# Patient Record
Sex: Male | Born: 1940 | Race: Asian | Hispanic: No | Marital: Married | Smoking: Former smoker
Health system: Southern US, Community
[De-identification: ages and names within clinical notes are randomized; demographics above are authoritative.]

## PROBLEM LIST (undated history)

## (undated) DIAGNOSIS — E039 Hypothyroidism, unspecified: Secondary | ICD-10-CM

## (undated) DIAGNOSIS — I10 Essential (primary) hypertension: Secondary | ICD-10-CM

## (undated) DIAGNOSIS — I2510 Atherosclerotic heart disease of native coronary artery without angina pectoris: Secondary | ICD-10-CM

## (undated) DIAGNOSIS — R918 Other nonspecific abnormal finding of lung field: Secondary | ICD-10-CM

## (undated) DIAGNOSIS — E785 Hyperlipidemia, unspecified: Secondary | ICD-10-CM

## (undated) DIAGNOSIS — E119 Type 2 diabetes mellitus without complications: Secondary | ICD-10-CM

## (undated) DIAGNOSIS — C61 Malignant neoplasm of prostate: Secondary | ICD-10-CM

## (undated) HISTORY — DX: Other nonspecific abnormal finding of lung field: R91.8

## (undated) HISTORY — DX: Atherosclerotic heart disease of native coronary artery without angina pectoris: I25.10

## (undated) HISTORY — DX: Hypothyroidism, unspecified: E03.9

## (undated) HISTORY — DX: Type 2 diabetes mellitus without complications: E11.9

## (undated) HISTORY — DX: Essential (primary) hypertension: I10

## (undated) HISTORY — DX: Hyperlipidemia, unspecified: E78.5

---

## 2008-07-25 ENCOUNTER — Ambulatory Visit (HOSPITAL_COMMUNITY): Admission: RE | Admit: 2008-07-25 | Discharge: 2008-07-25 | Payer: Self-pay | Admitting: Internal Medicine

## 2010-11-14 ENCOUNTER — Encounter: Payer: Self-pay | Admitting: Internal Medicine

## 2011-03-09 ENCOUNTER — Emergency Department (HOSPITAL_COMMUNITY)
Admission: EM | Admit: 2011-03-09 | Discharge: 2011-03-09 | Disposition: A | Discharge status: Other Acute Inpatient Hospital | Payer: Medicare Other | Source: Home / Self Care | Attending: Emergency Medicine | Admitting: Emergency Medicine

## 2011-03-09 ENCOUNTER — Emergency Department (HOSPITAL_COMMUNITY): Payer: Medicare Other

## 2011-03-09 ENCOUNTER — Inpatient Hospital Stay (HOSPITAL_COMMUNITY)
Admission: AD | Admit: 2011-03-09 | Discharge: 2011-03-12 | DRG: 247 | Disposition: A | Discharge status: Home / Self Care | Payer: Medicare Other | Source: Other Acute Inpatient Hospital | Attending: Cardiology | Admitting: Cardiology

## 2011-03-09 DIAGNOSIS — E039 Hypothyroidism, unspecified: Secondary | ICD-10-CM | POA: Insufficient documentation

## 2011-03-09 DIAGNOSIS — E119 Type 2 diabetes mellitus without complications: Secondary | ICD-10-CM | POA: Diagnosis present

## 2011-03-09 DIAGNOSIS — M549 Dorsalgia, unspecified: Secondary | ICD-10-CM | POA: Insufficient documentation

## 2011-03-09 DIAGNOSIS — I10 Essential (primary) hypertension: Secondary | ICD-10-CM | POA: Diagnosis present

## 2011-03-09 DIAGNOSIS — I214 Non-ST elevation (NSTEMI) myocardial infarction: Principal | ICD-10-CM | POA: Diagnosis present

## 2011-03-09 DIAGNOSIS — R079 Chest pain, unspecified: Secondary | ICD-10-CM

## 2011-03-09 DIAGNOSIS — R222 Localized swelling, mass and lump, trunk: Secondary | ICD-10-CM | POA: Insufficient documentation

## 2011-03-09 DIAGNOSIS — E785 Hyperlipidemia, unspecified: Secondary | ICD-10-CM | POA: Diagnosis present

## 2011-03-09 DIAGNOSIS — I2589 Other forms of chronic ischemic heart disease: Secondary | ICD-10-CM | POA: Diagnosis present

## 2011-03-09 DIAGNOSIS — Z7982 Long term (current) use of aspirin: Secondary | ICD-10-CM

## 2011-03-09 DIAGNOSIS — I2510 Atherosclerotic heart disease of native coronary artery without angina pectoris: Secondary | ICD-10-CM | POA: Diagnosis present

## 2011-03-09 DIAGNOSIS — R51 Headache: Secondary | ICD-10-CM | POA: Insufficient documentation

## 2011-03-09 DIAGNOSIS — E7800 Pure hypercholesterolemia, unspecified: Secondary | ICD-10-CM | POA: Insufficient documentation

## 2011-03-09 LAB — DIFFERENTIAL
Basophils Absolute: 0 10*3/uL (ref 0.0–0.1)
Basophils Relative: 1 % (ref 0–1)
Lymphocytes Relative: 27 % (ref 12–46)
Lymphs Abs: 1.7 10*3/uL (ref 0.7–4.0)
Monocytes Absolute: 0.9 10*3/uL (ref 0.1–1.0)
Monocytes Relative: 13 % — ABNORMAL HIGH (ref 3–12)
Neutro Abs: 3.6 10*3/uL (ref 1.7–7.7)
Neutrophils Relative %: 57 % (ref 43–77)

## 2011-03-09 LAB — BASIC METABOLIC PANEL
CO2: 27 mEq/L (ref 19–32)
Calcium: 9 mg/dL (ref 8.4–10.5)
Chloride: 101 mEq/L (ref 96–112)
GFR calc Af Amer: >60 mL/min (ref >60)
Glucose, Bld: 189 mg/dL — ABNORMAL HIGH (ref 70–99)
Potassium: 4.2 mEq/L (ref 3.5–5.1)
Sodium: 136 mEq/L (ref 135–145)

## 2011-03-09 LAB — TROPONIN I: Troponin I: 2.91 ng/mL (ref <0.30)

## 2011-03-09 LAB — CK TOTAL AND CKMB (NOT AT ARMC)
CK, MB: 3.1 ng/mL (ref 0.3–4.0)
Relative Index: 1.7 (ref 0.0–2.5)

## 2011-03-09 LAB — CBC
HCT: 38.9 % — ABNORMAL LOW (ref 39.0–52.0)
MCH: 30.8 pg (ref 26.0–34.0)
RBC: 4.29 MIL/uL (ref 4.22–5.81)
WBC: 6.3 10*3/uL (ref 4.0–10.5)

## 2011-03-09 MED ORDER — IOHEXOL 300 MG/ML  SOLN
100.0000 mL | Freq: Once | INTRAMUSCULAR | Status: AC | PRN
  Administered 2011-03-09: 100 mL via INTRAVENOUS

## 2011-03-10 ENCOUNTER — Institutional Professional Consult (permissible substitution): Payer: Medicare Other | Admitting: Cardiology

## 2011-03-10 DIAGNOSIS — I2510 Atherosclerotic heart disease of native coronary artery without angina pectoris: Secondary | ICD-10-CM

## 2011-03-10 LAB — TSH: TSH: 7.828 u[IU]/mL — ABNORMAL HIGH (ref 0.350–4.500)

## 2011-03-10 LAB — LIPID PANEL
HDL: 44 mg/dL (ref >39)
Triglycerides: 160 mg/dL — ABNORMAL HIGH (ref <150)
VLDL: 32 mg/dL (ref 0–40)

## 2011-03-10 LAB — CBC
HCT: 38 % — ABNORMAL LOW (ref 39.0–52.0)
MCH: 30.8 pg (ref 26.0–34.0)
MCV: 90 fL (ref 78.0–100.0)
RDW: 12.8 % (ref 11.5–15.5)
WBC: 6 10*3/uL (ref 4.0–10.5)

## 2011-03-10 LAB — POCT ACTIVATED CLOTTING TIME: Activated Clotting Time: 505 seconds

## 2011-03-10 LAB — PROTIME-INR: INR: 1.05 (ref 0.00–1.49)

## 2011-03-10 LAB — CARDIAC PANEL(CRET KIN+CKTOT+MB+TROPI)
Troponin I: 2.96 ng/mL (ref <0.30)
Troponin I: 2.48 ng/mL (ref <0.30)

## 2011-03-10 LAB — HEMOGLOBIN A1C: Hgb A1c MFr Bld: 7.2 % — ABNORMAL HIGH (ref <5.7)

## 2011-03-11 DIAGNOSIS — I214 Non-ST elevation (NSTEMI) myocardial infarction: Secondary | ICD-10-CM

## 2011-03-11 LAB — BASIC METABOLIC PANEL
GFR calc Af Amer: >60 mL/min (ref >60)
GFR calc non Af Amer: >60 mL/min (ref >60)
Potassium: 4 mEq/L (ref 3.5–5.1)
Sodium: 137 mEq/L (ref 135–145)

## 2011-03-11 LAB — CBC
Platelets: 279 10*3/uL (ref 150–400)
RDW: 12.6 % (ref 11.5–15.5)
WBC: 7 10*3/uL (ref 4.0–10.5)

## 2011-03-12 DIAGNOSIS — I059 Rheumatic mitral valve disease, unspecified: Secondary | ICD-10-CM

## 2011-03-12 LAB — BASIC METABOLIC PANEL
CO2: 27 mEq/L (ref 19–32)
GFR calc non Af Amer: >60 mL/min (ref >60)
Glucose, Bld: 158 mg/dL — ABNORMAL HIGH (ref 70–99)
Potassium: 4.3 mEq/L (ref 3.5–5.1)
Sodium: 138 mEq/L (ref 135–145)

## 2011-03-12 LAB — CBC
HCT: 36.3 % — ABNORMAL LOW (ref 39.0–52.0)
Hemoglobin: 12.7 g/dL — ABNORMAL LOW (ref 13.0–17.0)
RBC: 4.07 MIL/uL — ABNORMAL LOW (ref 4.22–5.81)
RDW: 12.5 % (ref 11.5–15.5)
WBC: 6.8 10*3/uL (ref 4.0–10.5)

## 2011-03-14 ENCOUNTER — Encounter: Payer: Self-pay | Admitting: Cardiology

## 2011-03-15 ENCOUNTER — Encounter: Payer: Self-pay | Admitting: Cardiology

## 2011-03-17 NOTE — Cardiovascular Report (Signed)
[name], [name]           ACCOUNT NO.:  [id number]  MEDICAL RECORD NO.:  [id number]           PATIENT TYPE:  I  LOCATION:  2917                         FACILITY:  [facility]  PHYSICIAN:  Yana Schorr M. [place], M.D.  DATE OF BIRTH:  1941/02/15  DATE OF PROCEDURE: DATE OF DISCHARGE:                           CARDIAC CATHETERIZATION   INDICATIONS FOR PROCEDURE:  A 70 year old male with history of hyperlipidemia and hypertension presents with a non-ST-elevation myocardial infarction.  PROCEDURE:  Left heart catheterization, coronary and left ventricular angiography and intracoronary stenting of the right coronary artery.  ACCESS:  Via the right radial artery using a standard Seldinger technique.  EQUIPMENT:  5-French 4 cm right Judkins catheter, 5-French XBU left coronary artery catheter, 5-French pigtail catheter, 5-French arterial sheath, 6-French left Amplatz one guide, 6-French arterial sheath, Prowater wire, a 2.0 x 20-mm Apex balloon, a 2.5 x 20-mm Apex balloon, a 2.5 x 33 mm Xience Prime LL stent, also a 2.5 x 38 mm Xience Prime LL stent, and a 2.75 x 21-mm Craig Sprinter RX balloon.  MEDICATIONS:  Local anesthesia with 1% Xylocaine, Versed a total of 3 mg IV, fentanyl total of 50 mcg IV, verapamil 3 mg intraarterial, heparin bolus at 3000 units IV, Angiomax 0.75 mg/kg IV bolus followed by continuous infusion of 1.75 mg/kg per hour.  Subsequent ACT was 505 seconds.  Effient 600 mg p.o., nitroglycerin 200 mcg intracoronary x3.  CONTRAST:  190 mL of Omnipaque.  HEMODYNAMIC DATA:  The aortic pressure is 137/64 with a mean of 94 mmHg. Left ventricle pressure is 137 with an EDP of 14 mmHg.  ANGIOGRAPHIC DATA:  The right coronary arises anteriorly.  There is a long 99% stenosis in the midvessel with TIMI grade 1 flow.  There are faint left to right collaterals to the posterolateral branches.  The left main coronary artery is heavily calcified.  There is a 30-40% narrowing in the  distal left main.  The left anterior descending artery is also calcified proximally.  It has diffuse 30% narrowing in the midvessel.  The first diagonal branch is occluded proximally.  The left circumflex coronary artery has 20-30% disease at the ostium and in the midvessel.  Left ventricular angiography performed in the RAO view demonstrates moderate-to-severe inferior wall hypokinesis with akinesis of the distal anterior wall.  Overall ejection fraction is 40-45%.  We proceeded at this point with a percutaneous intervention of the right coronary artery which appeared to be the culprit lesion.  After anticoagulation, we engaged the vessel with a left Amplatz 1 guide.  We were able to cross with a wire with minimal difficulty.  We then predilated the lesion using a 2.0 x 20-mm Apex balloon up to 12 atmospheres.  We then dilated the same segment using a 2.5 mm balloon up to 12 atmospheres.  We then used 2 long stents to cover the entire diseased segment.  We placed a 2.5 x 33 mm Xience Prime stent further distally covering the crux of the right coronary artery.  This was deployed at 12 atmospheres.  We then deployed a second 2.5 x 38 mm Xience Prime stent more proximally with overlap  of the initial stent. This was also deployed at 12 atmospheres.  We postdilated the entire stented segment with a 2.5 x 21-mm Palmer Sprinter RX balloon up to 16 atmospheres x4.  This yielded an excellent angiographic result with 0% residual stenosis and TIMI grade 3 flow.  FINAL INTERPRETATION: 1. Two-vessel obstructive coronary artery disease involving the right     coronary artery and diagonal. 2. Moderate left ventricular dysfunction. 3. Successful stenting of the proximal to mid right coronary artery     with 2 overlapping drug-eluting stents.  PLAN:  We will continue on aspirin and Plavix indefinitely.          ______________________________ Donda Friedli M. [place], M.D.     PMJ/MEDQ  D:   03/10/2011  T:  03/11/2011  Job:  [id number]  cc:   Margaretmary Bayley, M.D. Rollene Rotunda, MD, [facility]  Electronically Signed by Omega Durante [place] M.D. on 03/17/2011 11:51:57 AM

## 2011-03-18 NOTE — Discharge Summary (Signed)
NAMESHELLIE, GOETTL           ACCOUNT NO.:  [id number]  MEDICAL RECORD NO.:  [id number]           PATIENT TYPE:  I  LOCATION:  2001                         FACILITY:  [facility]  PHYSICIAN:  Keith Range, MD       DATE OF BIRTH:  1941/01/26  DATE OF ADMISSION:  03/09/2011 DATE OF DISCHARGE:  03/12/2011                              DISCHARGE SUMMARY   PRIMARY CARDIOLOGIST:  Rollene Rotunda, MD, [facility]  PRIMARY CARE PHYSICIAN:  Margaretmary Bayley, MD  REASON FOR ADMISSION:  Non-ST-elevation myocardial infarction.  DISCHARGE DIAGNOSES: 1. Coronary artery disease status post non-ST elevation myocardial     infarction this admission.     a.     Treated with drug-eluting stent x2 to the proximal mid right      coronary artery this admission. 2. Ischemic cardiomyopathy with ejection fraction of 40-45% and     moderate to severe inferior hypokinesis and distal anterior     akinesis.     a.     Followup echocardiogram Mar 12, 2011 demonstrates improved      EF of 50-55% with grade 1 diastolic dysfunction. 3. Hypertension. 4. Hyperlipidemia. 5. Hypothyroidism. 6. Left upper lobe mass on chest CT.     a.     Repeat chest CT recommended in 4-6 months.  PROCEDURE PERFORMED THIS ADMISSION:  Cardiac catheterization, percutaneous intervention by Dr. Peter [place] Mar 10, 2011:  Status post drug-eluting stent placement x2 to the RCA, chronically occluded first diagonal, EF 40-45%.  ADMISSION HISTORY:  Mr. Keith [name] is a 70 year old male who presented to the emergency room with about 4 days of stuttering chest pain.  His cardiac markers were abnormal.  His D-dimer was negative.  He was admitted for non-ST elevation myocardial infarction for further evaluation and treatment.  HOSPITAL COURSE:  He was treated with aspirin, heparin, and beta- blocker.  His losartan HCT was switched to losartan 25 mg and Toprol-XL 25 mg a day was also added.  He did have a chest and abdominal CT with contrast.  This  was negative for pulmonary emboli.  He did have coronary calcifications and a 9-mm pleural based spiculated mass of the left upper lobe.  A 4-6 month followup is recommended.  His abdominal CT was negative.  He underwent cardiac catheterization by Dr. Peter [place] on Mar 10, 2011.  This demonstrated 99% stenosis in mid vessel of the RCA, 30-40% distal left main stenosis, 30% mid LAD stenosis, first diagonal was occluded and left circumflex had 20-30% ostial mid vessel stenosis. Left ventriculogram demonstrated moderate-to-severe inferior hypokinesis and distal anterior akinesis with an EF of 40-45%.  The patient subsequently underwent PCI with successful drug eluting stent placement x2 to the mid RCA (2.5 x 33 mm and 2.5 x 38 mm Xience Prime drug-eluting stents).  Aspirin and Effient will be recommended for a minimum of 12 months.  He was noted to have mild hyperglycemia and hemoglobin A1c was abnormal at 7.2.  He would likely be a good candidate for metformin but given his recent cardiac catheterization this was not started at the time of discharge.  He should follow up closely with  his primary care physician for further management of his new onset diabetes mellitus.  He was evaluated by Dr. Johney Frame on Mar 12, 2011 and felt to be stable enough for discharge to home.  LABORATORY AND ANCILLARY DATA:  Hemoglobin 12.7, MCV 89.2, platelet count 273,000, D-dimer less than 0.22.  Sodium 138, potassium 4.3, glucose 158, BUN 10, creatinine 0.96, hemoglobin A1c 7.2.  Peak troponin 2.96.  CK-MBs negative x3.  Total cholesterol 172, triglycerides 160, HDL 44, LDL 96.  TSH 7.828, free T4 1.09.  Chest CT as noted above.  CT of the head with minimal chronic microvascular ischemia.  No acute abnormalities.  Chest x-ray negative.  Echocardiogram performed Mar 12, 2011 demonstrates mild focal basal hypertrophy of the septum, EF 50-55%, distal anteroseptal and apical hypokinesis, grade 1  diastolic dysfunction, mild mitral regurgitation, trivial tricuspid regurgitation, no pericardial effusion.  ALLERGIES:  No known drug allergies.  DISCHARGE MEDICATIONS: 1. Losartan 25 mg daily. 2. Metoprolol succinate XL 25 mg daily. 3. Effient 10 mg daily. 4. Pravastatin was increased from 40-80 mg at bedtime this admission. 5. Aspirin 81 mg daily. 6. Centrum daily. 7. Levothyroxine 125 mcg daily.  He has been advised to stop taking losartan HCTZ 100/12.5 mg.  ACTIVITY:  He is to increase activity slowly, he may shower, he may walk steps.  No lifting or sexual activity for 2 weeks, no driving for 1 week.  DIET:  Low-fat, low-sodium diet.  WOUND CARE:  He should call our office for any swelling, bleeding, bruising, or fever.  FOLLOWUP: 1. He will need to see Dr. Antoine Poche or the physician assistant in 2 weeks     and the office will contact him with appointment. 2. He should follow up with Dr. Chestine Spore in the next 2 weeks for further     management of his newly diagnosed diabetes mellitus. 3. He will need a Chest CT with contrast in 4-6 months to follow up      on the Left Upper Lobe mass.  Total physician and PA time greater than 30 minutes.     Keith Newcomer, PA-C   ______________________________ Keith Range, MD    SW/MEDQ  D:  03/12/2011  T:  03/13/2011  Job:  [id number]  cc:   Margaretmary Bayley, M.D.  Electronically Signed by Keith Newcomer PA-C on 03/13/2011 07:30:28 AM Electronically Signed by Keith Range MD on 03/18/2011 05:54:53 PM

## 2011-03-30 ENCOUNTER — Encounter: Payer: Self-pay | Admitting: Physician Assistant

## 2011-03-30 ENCOUNTER — Ambulatory Visit (INDEPENDENT_AMBULATORY_CARE_PROVIDER_SITE_OTHER): Payer: Medicare Other | Admitting: Physician Assistant

## 2011-03-30 VITALS — BP 134/68 | Ht 65.0 in | Wt 120.0 lb

## 2011-03-30 DIAGNOSIS — E785 Hyperlipidemia, unspecified: Secondary | ICD-10-CM | POA: Insufficient documentation

## 2011-03-30 DIAGNOSIS — I2510 Atherosclerotic heart disease of native coronary artery without angina pectoris: Secondary | ICD-10-CM

## 2011-03-30 DIAGNOSIS — R222 Localized swelling, mass and lump, trunk: Secondary | ICD-10-CM

## 2011-03-30 DIAGNOSIS — E119 Type 2 diabetes mellitus without complications: Secondary | ICD-10-CM

## 2011-03-30 DIAGNOSIS — E039 Hypothyroidism, unspecified: Secondary | ICD-10-CM | POA: Insufficient documentation

## 2011-03-30 DIAGNOSIS — I10 Essential (primary) hypertension: Secondary | ICD-10-CM

## 2011-03-30 DIAGNOSIS — R918 Other nonspecific abnormal finding of lung field: Secondary | ICD-10-CM

## 2011-03-30 DIAGNOSIS — I255 Ischemic cardiomyopathy: Secondary | ICD-10-CM | POA: Insufficient documentation

## 2011-03-30 DIAGNOSIS — I2589 Other forms of chronic ischemic heart disease: Secondary | ICD-10-CM

## 2011-03-30 MED ORDER — LOSARTAN POTASSIUM 50 MG PO TABS: 50.0000 mg | ORAL_TABLET | Freq: Every day | ORAL | Status: DC

## 2011-03-30 MED ORDER — PRASUGREL HCL 10 MG PO TABS: 10.0000 mg | ORAL_TABLET | Freq: Every day | ORAL | Status: DC

## 2011-03-30 NOTE — Assessment & Plan Note (Signed)
He has a remote history of smoking.  We will arrange a followup CT in 3 months.

## 2011-03-30 NOTE — Patient Instructions (Addendum)
Increase losartan to 50 mg daily.  You can take 2 tablets of the 25 mg to equal 50 mg.  A new prescription has been sent to your pharmacy.  Your physician recommends that you return for lab work in: 1-2  WEEKS BMET 401.9  You have been referred to CARDIAC REHAB IN [place] AS PER [name], PA-C  CHEST CT FOR LUNG NODULE 786.6 THIS IS TO BE DONE IN 3 MONTHS AS PER [name], PA-C  A [facility] FORM WAS FILLED OUT TODAY FOR ASSISTANCE IN GETTING YOUR EFFIENT, AND WAS ALSO FAXED TODAY

## 2011-03-30 NOTE — Assessment & Plan Note (Addendum)
Doing well post MI.  He is having trouble affording Effient.  We will enroll him in Effient assistance.  I discussed the importance of dual antiplatelet therapy.  Continue statin therapy.  We will refer him to cardiac rehabilitation in [facility].  Follow up with Dr. Antoine Poche in 3 months.

## 2011-03-30 NOTE — Progress Notes (Signed)
History of Present Illness: Primary Cardiologist:  Dr. Rollene Rotunda  Keith [name] is a 70 y.o. male who presented to [facility] 5/16 with chest pain.  He ruled in for a non-ST elevation myocardial infarction.  Cardiac catheterization demonstrated critical stenosis in the mid RCA that was treated with 2 drug-eluting stents.  He had an occluded first diagonal.  His EF was 40-45% with inferior hypokinesis and distal anterior akinesis.  Follow up echocardiogram demonstrated improved LV function with an EF 50-55%.  He did have a chest CT that demonstrated a left upper lobe mass.  Follow up CT was recommended in 4-6 months.  He was placed on aspirin and Effient.  His pravastatin was increased.  He returns today for follow up.  He is doing well.  No chest pain, dyspnea, orthopnea, PND, edema or syncope.  He is taking all medications.  Having trouble affording Effient.  He is interested in cardiac rehab.  He has followed up with his PCP for his diabetes.    Past Medical History  Diagnosis Date  . CAD (coronary artery disease)     a. NSTEMI 5/12: Xience DES x 2 to RCA; b. cath 5/12: mRCA 99% (tx with PCI); dLM 30-40%, pLAD 30%, D1 occluded, CFX 20-30%, EF 40-45% with mod to severe inf HK and dist Ant AK  . Ischemic cardiomyopathy     a. echo 5/12 (after PCI): EF 50-55%, AS and apical HK, grade 1 diast dysfxn, mild MR  . DM2 (diabetes mellitus, type 2)   . HTN (hypertension)   . HLD (hyperlipidemia)   . Hypothyroidism   . Lung mass     LUL mass on chest CT 5/12; need repeat in 06/2011    Current Outpatient Prescriptions  Medication Sig Dispense Refill  . aspirin 81 MG tablet Take 81 mg by mouth daily.        Marland Kitchen levothyroxine (SYNTHROID, LEVOTHROID) 125 MCG tablet Take 125 mcg by mouth daily.        Marland Kitchen losartan (COZAAR) 50 MG tablet Take 1 tablet (50 mg total) by mouth daily.  30 tablet  11  . metoprolol succinate (TOPROL-XL) 25 MG 24 hr tablet Take 25 mg by mouth daily.        .  Multiple Vitamin (MULTIVITAMIN) tablet Take 1 tablet by mouth daily.        . prasugrel (EFFIENT) 10 MG TABS Take 10 mg by mouth daily.        . pravastatin (PRAVACHOL) 80 MG tablet Take 80 mg by mouth daily.        Marland Kitchen DISCONTD: losartan (COZAAR) 25 MG tablet Take 25 mg by mouth daily.          Allergies: No Known Allergies  Vital Signs: BP 134/68  Ht 5\' 5"  (1.651 m)  Wt 120 lb (54.432 kg)  BMI 19.97 kg/m2  PHYSICAL EXAM: Well nourished, well developed, in no acute distress HEENT: normal Neck: no JVD Cardiac:  normal S1, S2; RRR; no murmur Lungs:  clear to auscultation bilaterally, no wheezing, rhonchi or rales Abd: soft, nontender, no hepatomegaly Ext: no edema; Right radial site without hematoma or bruit Skin: warm and dry Neuro:  CNs 2-12 intact, no focal abnormalities noted  EKG:  Sinus bradycardia, heart rate 57, normal axis, T wave inversion in one, aVL  ASSESSMENT AND PLAN:

## 2011-03-30 NOTE — Assessment & Plan Note (Signed)
Goal A1c would be 7.5-8.  Followup with PCP.

## 2011-03-30 NOTE — Assessment & Plan Note (Signed)
Goal LDL less than 70.  This is managed by his primary care physician.

## 2011-03-30 NOTE — Assessment & Plan Note (Signed)
Ejection fraction has recovered.  He has no signs or symptoms of congestive heart failure.  Continue beta blocker and ARB therapy.

## 2011-03-30 NOTE — Assessment & Plan Note (Signed)
Goal blood pressure less than 130/80.  With his ischemic myopathy, increase losartan 50 mg daily.  Check a basic metabolic panel in one week.

## 2011-04-01 NOTE — H&P (Signed)
Keith [name], [name]           ACCOUNT NO.:  [id number]  MEDICAL RECORD NO.:  [id number]           PATIENT TYPE:  E  LOCATION:  WLED                         FACILITY:  [facility]  PHYSICIAN:  Wendi Snipes, MD DATE OF BIRTH:  1941/07/18  DATE OF ADMISSION:  03/09/2011 DATE OF DISCHARGE:                             HISTORY & PHYSICAL   CARDIOLOGIST:  Rollene Rotunda, MD, [facility].  PRIMARY DOCTOR:  Margaretmary Bayley, M.D.  CHIEF COMPLAINTS:  Chest pain.  HISTORY OF PRESENT ILLNESS:  This is a 70 year old man with a history of hypertension, hyperlipidemia who presents with chest pain for approximately 4 or 5 days.  He states his symptoms began with gastric reflux symptoms on Saturday which was treated with over-the-counter antacids.  This seem to have helped though his discomfort returned on Sunday and was associated with profound weakness and lack of appetite. This pain was described as midchest, slightly pleuritic with a reproducible component radiating to his back.  This pain did continue for 2 days and he saw his primary care physician on Tuesday.  At this time, an EKG and lab work was performed and the following day he was referred to cardiologist regarding these findings which are unknown to me.  Today, the pain continued and his son brought him to the emergency department for further evaluation.  The pain does not improve with sitting.  He has not had any recent viral illnesses.  He does describe worsen discomfort when he walks to the bathroom and the pain increases as he sits up.  A CT angiogram performed in the emergency department ruled out dissection and pulmonary embolism was negative.  He has otherwise been in his usual state of health prior to the aforementioned symptoms.  PAST MEDICAL HISTORY: 1. Hypertension. 2. Hyperlipidemia. 3. Hypothyroidism.  ALLERGIES:  No known drug allergies.  MEDICATIONS ON ADMISSION:  Aspirin 81 mg daily, multivitamin daily, Synthroid  125 mcg daily, losartan/hydrochlorothiazide 100 mg/12.5 mg daily, pravastatin 40 mg daily.  SOCIAL HISTORY:  He lives in [place] with his son.  He is currently retired.  He does not smoke.  FAMILY HISTORY:  His father had a myocardial infarction at age 42.  His brother had myocardial infarction at age 24 and several other of his family members have had myocardial infarctions.  REVIEW OF SYSTEMS:  All 14 systems were reviewed and were negative except as mentioned in detail in HPI.  PHYSICAL EXAMINATION:  VITAL SIGNS:  His blood pressure is 143/74, respiratory rate is 16, pulses 66, satting 99% on room air. GENERAL:  He is a 70 year old male appearing stated age, in no acute distress. HEENT:  Moist mucous membranes.  Pupils equal, round, reactive to light accommodation.  Anicteric sclerae. NECK:  No jugular venous distention.  No thyromegaly. CARDIOVASCULAR:  Regular rate and rhythm.  No murmurs, rubs or gallops. LUNGS:  Clear to auscultation bilaterally. ABDOMEN:  Nontender, nondistended.  Positive bowel sounds.  No masses. EXTREMITIES:  No clubbing, cyanosis or edema. NEUROLOGIC:  Alert and x3.  Cranial nerves II through XII are grossly intact.  No focal neurologic deficits. SKIN:  Warm, dry and intact.  No rashes.  PSYCH:  Mood and affect appropriate.  RADIOLOGY:  Chest CT showed no acute abnormality of the abdomen, pelvis. Additionally had a head CT which showed no acute process.  EKG, normal sinus rhythm, rate of 74 beats per minute with questionable inferior ST changes but nondiagnostic of overt ischemia.  LABORATORY REVIEW:  White cell count of 6.3, hematocrit 38.9, potassium is 4.2, creatinine 0.9, blood sugar is 189, D-dimer is less than 0.22. Troponin is 2.91.  His CK-MB is 3.1.  ASSESSMENT AND PLAN:  This is a 70 year old male here with hypertension, hyperlipidemia, family history of early coronary artery disease who presents with chest pain for approximately 5  days, now describing pleuritic chest pain and having elevated cardiac biomarkers 1. Non-ST elevation myocardial infarction.  This is probably a     subacute process and late presenting myocardial infarction,     although this could be myopericarditis.  His history suggests it is     much more worrisome for late presenting myocardial infarction.  We     will treat him as such.  We will start heparin drip and continue to     cycle his cardiac enzymes.  Will transfer him to [facility] step-     down unit in anticipation for cardiac catheter in the morning.     Will consider anti-inflammatory treatment.  His coronaries are     clear.  Will continue to monitor his chest pain closely with repeat     EKGs as well. 2. Hypertension.  His blood pressure is currently goal.  Continue his     ARB 3. Hyperlipidemia.  We will check a fasting lipid profile and continue     statin medication.     Wendi Snipes, MD     BHH/MEDQ  D:  03/09/2011  T:  03/09/2011  Job:  [id number]  Electronically Signed by Jim Desanctis MD on 04/01/2011 08:54:43 PM

## 2011-04-04 ENCOUNTER — Telehealth: Payer: Self-pay | Admitting: Cardiology

## 2011-04-04 DIAGNOSIS — K625 Hemorrhage of anus and rectum: Secondary | ICD-10-CM

## 2011-04-04 NOTE — Telephone Encounter (Signed)
He has two DES recently.  He is at high risk if he does not take dual antiplatelet therapy.  Needs a CBC in the am.  Needs to be worked in with GI.  Needs to restart the Effient.

## 2011-04-04 NOTE — Telephone Encounter (Signed)
Spoke with son who states that the pt had bright red blood on the toliet paper after having a BM.  Pt stopped his Effient and hasn't had any Sunday or today.  Pt states this also happened on June 1 as well.  Will inform Dr Antoine Poche and call son back with orders.  He is in agreement

## 2011-04-04 NOTE — Telephone Encounter (Signed)
Pt stopped effient two days ago due to blood in bowels-pt's son, Viviano Simas [phone]

## 2011-04-04 NOTE — Telephone Encounter (Signed)
Pt's son Viviano Simas is aware of the importance of patient taking Effient as ordered.  He will have his father restart it today.  Ajay instructed that the pt needs a CBC tomorrow and an appointment with GI to evaluate bleeding.  Son states he is unable to bring the pt for the lab work tomorrow but will bring him on Wednesday.  Son aware he will be contacted with an appointment with GI.

## 2011-04-06 ENCOUNTER — Other Ambulatory Visit (INDEPENDENT_AMBULATORY_CARE_PROVIDER_SITE_OTHER): Payer: Medicare Other | Admitting: *Deleted

## 2011-04-06 DIAGNOSIS — I2510 Atherosclerotic heart disease of native coronary artery without angina pectoris: Secondary | ICD-10-CM

## 2011-04-06 DIAGNOSIS — Z79899 Other long term (current) drug therapy: Secondary | ICD-10-CM

## 2011-04-07 LAB — CBC WITH DIFFERENTIAL/PLATELET
Basophils Relative: 0.6 % (ref 0.0–3.0)
Eosinophils Relative: 1.8 % (ref 0.0–5.0)
HCT: 37.6 % — ABNORMAL LOW (ref 39.0–52.0)
Hemoglobin: 13 g/dL (ref 13.0–17.0)
Lymphs Abs: 2 10*3/uL (ref 0.7–4.0)
MCV: 93.6 fl (ref 78.0–100.0)
Monocytes Absolute: 0.5 10*3/uL (ref 0.1–1.0)
Neutrophils Relative %: 49.7 % (ref 43.0–77.0)
RBC: 4.01 Mil/uL — ABNORMAL LOW (ref 4.22–5.81)
WBC: 5.2 10*3/uL (ref 4.5–10.5)

## 2011-04-07 NOTE — Telephone Encounter (Signed)
Pt has appt with Dr Jarold Motto 04/19/2011 for GI evaluation.

## 2011-04-12 ENCOUNTER — Telehealth: Payer: Self-pay | Admitting: Cardiology

## 2011-04-12 NOTE — Telephone Encounter (Signed)
Called and left message for pt that the blood work that he is scheduled for is to follow up after starting new medication on 03/30/11 and that it is different blood work than what he had on 6/13.  Requested pt call back if further questions or needs

## 2011-04-12 NOTE — Telephone Encounter (Signed)
Pt is questioning why he need to return to office for lab work on 6/21. When he had lab work on 6/13.

## 2011-04-14 ENCOUNTER — Other Ambulatory Visit (INDEPENDENT_AMBULATORY_CARE_PROVIDER_SITE_OTHER): Payer: Medicare Other | Admitting: *Deleted

## 2011-04-14 DIAGNOSIS — I10 Essential (primary) hypertension: Secondary | ICD-10-CM

## 2011-04-15 ENCOUNTER — Telehealth: Payer: Self-pay | Admitting: Cardiology

## 2011-04-15 LAB — BASIC METABOLIC PANEL
BUN: 14 mg/dL (ref 6–23)
GFR: 85.12 mL/min (ref >60.00)
Glucose, Bld: 112 mg/dL — ABNORMAL HIGH (ref 70–99)
Potassium: 4.2 mEq/L (ref 3.5–5.1)

## 2011-04-15 NOTE — Telephone Encounter (Signed)
SPOKE WITH  PT'S SON DAD C/O BRUISING TO ABD MATCHED TO SEAT BELT AREA  NOTICED 3 DAYS AGO AS WELL AS PAIN TO CHEST WITH TOUCH 2 DAYS.DISCUSSED WITH DR [facility]  NEEDS TO F/U WITH PMD , PT'S SON AWARE./CY

## 2011-04-15 NOTE — Telephone Encounter (Signed)
Pt is having bruising on the left side of stomach and not sure where it comes from and he is having some off and on chest pain and it is to the touch and he wants to discuss this

## 2011-04-19 ENCOUNTER — Other Ambulatory Visit (INDEPENDENT_AMBULATORY_CARE_PROVIDER_SITE_OTHER): Payer: Medicare Other

## 2011-04-19 ENCOUNTER — Encounter: Payer: Self-pay | Admitting: Gastroenterology

## 2011-04-19 ENCOUNTER — Ambulatory Visit (INDEPENDENT_AMBULATORY_CARE_PROVIDER_SITE_OTHER): Payer: Medicare Other | Admitting: Gastroenterology

## 2011-04-19 VITALS — BP 136/74 | HR 76 | Ht 65.0 in | Wt 122.0 lb

## 2011-04-19 DIAGNOSIS — Z1211 Encounter for screening for malignant neoplasm of colon: Secondary | ICD-10-CM

## 2011-04-19 DIAGNOSIS — I214 Non-ST elevation (NSTEMI) myocardial infarction: Secondary | ICD-10-CM

## 2011-04-19 DIAGNOSIS — R6889 Other general symptoms and signs: Secondary | ICD-10-CM

## 2011-04-19 DIAGNOSIS — D509 Iron deficiency anemia, unspecified: Secondary | ICD-10-CM | POA: Insufficient documentation

## 2011-04-19 DIAGNOSIS — K625 Hemorrhage of anus and rectum: Secondary | ICD-10-CM

## 2011-04-19 DIAGNOSIS — Z7901 Long term (current) use of anticoagulants: Secondary | ICD-10-CM

## 2011-04-19 DIAGNOSIS — I219 Acute myocardial infarction, unspecified: Secondary | ICD-10-CM

## 2011-04-19 LAB — CBC WITH DIFFERENTIAL/PLATELET
Basophils Relative: 0.4 % (ref 0.0–3.0)
Eosinophils Relative: 2.4 % (ref 0.0–5.0)
Lymphocytes Relative: 29.3 % (ref 12.0–46.0)
MCV: 94.2 fl (ref 78.0–100.0)
Monocytes Absolute: 0.3 10*3/uL (ref 0.1–1.0)
Monocytes Relative: 7.9 % (ref 3.0–12.0)
Neutrophils Relative %: 60 % (ref 43.0–77.0)
RBC: 4.23 Mil/uL (ref 4.22–5.81)
WBC: 4.4 10*3/uL — ABNORMAL LOW (ref 4.5–10.5)

## 2011-04-19 LAB — IBC PANEL: Iron: 94 ug/dL (ref 42–165)

## 2011-04-19 MED ORDER — PEG-KCL-NACL-NASULF-NA ASC-C 100 G PO SOLR: 1.0000 | Freq: Once | ORAL | Status: DC

## 2011-04-19 NOTE — Patient Instructions (Addendum)
Your procedure has been scheduled for 05/16/2011, please follow the seperate instructions.  Your prescription(s) have been sent to you pharmacy.  Please go to the basement today for your labs.

## 2011-04-19 NOTE — Progress Notes (Signed)
History of Present Illness:  This is a very pleasant 70 year old [place] male with multiple cardiovascular issues referred for evaluation of a symptomatic rectal bleeding with 2 episodes of small amounts of bright red blood on June 1 and June 9. He denies abdominal pain, lower rate of 30, melena, or passage of blood clots. He is status post insertion of a drug-eluting stent also non-ST elevation MI 6 weeks ago. He subsequently is minimal aspirin and Effient 10 mg a day. He has not had previous barium studies or endoscopic exams of his gut. He specifically denies acid reflux symptoms, dysphagia, history of peptic ulcer disease, hepatitis, pancreatitis, abdominal pain, or bowel irregularity. Family history is noncontributory. Review of his labs showed a mild anemia which has been present for the last year. He specifically denies abuse of alcohol, cigarettes, or NSAIDs. His appetite is good his weight is stable. He is followed readily by Dr. Antoine Poche cardiology. He sees Dr. Chestine Spore for chronic thyroid dysfunction.  I have reviewed this patient's present history, medical and surgical past history, allergies and medications.     ROS: The remainder of the 10 point ROS is negative     Physical Exam: General well developed well nourished patient in no acute distress, appearing his stated age Eyes PERRLA, no icterus, fundoscopic exam per opthamologist Skin no lesions noted Neck supple, no adenopathy, no thyroid enlargement, no tenderness Chest clear to percussion and auscultation Heart no significant murmurs, gallops or rubs noted Abdomen no hepatosplenomegaly masses or tenderness, BS normal.  Rectal inspection normal no fissures, or fistulae noted.  No masses or tenderness on digital exam. Stool guaiac negative. Extremities no acute joint lesions, edema, phlebitis or evidence of cellulitis. Neurologic patient oriented x 3, cranial nerves intact, no focal neurologic deficits noted. Psychological mental  status normal and normal affect.  Assessment and plan: Probable hemorrhoidal bleeding related to anticoagulation. However, records reviewed include colonic polyposis and/or colon cancer. I've spoken with Dr. Antoine Poche by telephone, and we have agreed to proceed with colonoscopy while the patient is all anticoagulants, and to deal with the findings at a later date if indicated. We will put this procedure all for another month because of his cardiac event. CBC and anemia profile ordered. Colonoscopy in with his risk of side effect reviewed in detail with the patient and his son Forde Radon. I have urged Mr. Nyman to continue his anticoagulation as recommended by cardiology.  Encounter Diagnoses  Name Primary?  . Special screening for malignant neoplasms, colon Yes  . Other general symptoms   . Iron deficiency anemia, unspecified

## 2011-05-16 ENCOUNTER — Encounter: Payer: Self-pay | Admitting: Gastroenterology

## 2011-05-16 ENCOUNTER — Other Ambulatory Visit: Payer: Self-pay | Admitting: Gastroenterology

## 2011-05-16 ENCOUNTER — Ambulatory Visit (AMBULATORY_SURGERY_CENTER): Payer: Medicare Other | Admitting: Gastroenterology

## 2011-05-16 DIAGNOSIS — K644 Residual hemorrhoidal skin tags: Secondary | ICD-10-CM | POA: Insufficient documentation

## 2011-05-16 DIAGNOSIS — Z1211 Encounter for screening for malignant neoplasm of colon: Secondary | ICD-10-CM

## 2011-05-16 DIAGNOSIS — K625 Hemorrhage of anus and rectum: Secondary | ICD-10-CM

## 2011-05-16 MED ORDER — SODIUM CHLORIDE 0.9 % IV SOLN: 500.0000 mL | INTRAVENOUS | Status: DC

## 2011-05-16 MED ORDER — HYDROCORTISONE ACE-PRAMOXINE 1-1 % RE CREA: TOPICAL_CREAM | Freq: Two times a day (BID) | RECTAL | Status: AC

## 2011-05-17 ENCOUNTER — Telehealth: Payer: Self-pay | Admitting: *Deleted

## 2011-05-17 NOTE — Telephone Encounter (Signed)

## 2011-07-06 ENCOUNTER — Encounter: Payer: Self-pay | Admitting: Cardiology

## 2011-07-07 ENCOUNTER — Ambulatory Visit (INDEPENDENT_AMBULATORY_CARE_PROVIDER_SITE_OTHER)
Admission: RE | Admit: 2011-07-07 | Discharge: 2011-07-07 | Disposition: A | Discharge status: Home / Self Care | Payer: Medicare Other | Source: Ambulatory Visit | Attending: Physician Assistant | Admitting: Physician Assistant

## 2011-07-07 ENCOUNTER — Encounter: Payer: Self-pay | Admitting: Cardiology

## 2011-07-07 ENCOUNTER — Ambulatory Visit (INDEPENDENT_AMBULATORY_CARE_PROVIDER_SITE_OTHER): Payer: Medicare Other | Admitting: Cardiology

## 2011-07-07 DIAGNOSIS — I2589 Other forms of chronic ischemic heart disease: Secondary | ICD-10-CM

## 2011-07-07 DIAGNOSIS — I10 Essential (primary) hypertension: Secondary | ICD-10-CM

## 2011-07-07 DIAGNOSIS — R222 Localized swelling, mass and lump, trunk: Secondary | ICD-10-CM

## 2011-07-07 DIAGNOSIS — R918 Other nonspecific abnormal finding of lung field: Secondary | ICD-10-CM

## 2011-07-07 DIAGNOSIS — E785 Hyperlipidemia, unspecified: Secondary | ICD-10-CM

## 2011-07-07 DIAGNOSIS — I255 Ischemic cardiomyopathy: Secondary | ICD-10-CM

## 2011-07-07 DIAGNOSIS — I2510 Atherosclerotic heart disease of native coronary artery without angina pectoris: Secondary | ICD-10-CM

## 2011-07-07 NOTE — Assessment & Plan Note (Signed)
The patient has no new sypmtoms.  No further cardiovascular testing is indicated.  We will continue with aggressive risk reduction and meds as listed.  He is having trouble affording the Effient and I will try to talk to the company to get him help with this.

## 2011-07-07 NOTE — Patient Instructions (Signed)
The current medical regimen is effective;  continue present plan and medications.  Follow up in 6 months with Dr Hochrein.  You will receive a letter in the mail 2 months before you are due.  Please call us when you receive this letter to schedule your follow up appointment.  

## 2011-07-07 NOTE — Assessment & Plan Note (Signed)
Preliminary CT today demonstrates no change in this.  It is suggested that this be repeated in six months.

## 2011-07-07 NOTE — Assessment & Plan Note (Signed)
I will defer follow up of this to Laurena Slimmer, MD, MD

## 2011-07-07 NOTE — Assessment & Plan Note (Signed)
The blood pressure is at target. No change in medications is indicated. We will continue with therapeutic lifestyle changes (TLC).  

## 2011-07-07 NOTE — Progress Notes (Signed)
HPI The patient presents for evaluation of CAD.  Since I last saw her she has done well. The patient denies any new symptoms such as chest discomfort, neck or arm discomfort. There has been no new shortness of breath, PND or orthopnea. There have been no reported palpitations, presyncope or syncope.  He is exercising and has none of his previous angina.    No Known Allergies  Current Outpatient Prescriptions  Medication Sig Dispense Refill  . aspirin 81 MG tablet Take 81 mg by mouth daily.        Marland Kitchen levothyroxine (SYNTHROID, LEVOTHROID) 112 MCG tablet Take 112 mcg by mouth daily.        Marland Kitchen losartan (COZAAR) 50 MG tablet Take 1 tablet (50 mg total) by mouth daily.  30 tablet  11  . metoprolol succinate (TOPROL-XL) 25 MG 24 hr tablet Take 25 mg by mouth daily.        . Multiple Vitamin (MULTIVITAMIN) tablet Take 1 tablet by mouth daily.        . prasugrel (EFFIENT) 10 MG TABS Take 1 tablet (10 mg total) by mouth daily.  30 tablet  6  . pravastatin (PRAVACHOL) 80 MG tablet Take 80 mg by mouth daily.         Current Facility-Administered Medications  Medication Dose Route Frequency Provider Last Rate Last Dose  . 0.9 %  sodium chloride infusion  500 mL Intravenous Continuous Sheryn Bison, MD        Past Medical History  Diagnosis Date  . CAD (coronary artery disease)     a. NSTEMI 5/12: Xience DES x 2 to RCA; b. cath 5/12: mRCA 99% (tx with PCI); dLM 30-40%, pLAD 30%, D1 occluded, CFX 20-30%, EF 40-45% with mod to severe inf HK and dist Ant AK  . Ischemic cardiomyopathy     a. echo 5/12 (after PCI): EF 50-55%, AS and apical HK, grade 1 diast dysfxn, mild MR  . DM2 (diabetes mellitus, type 2)   . HTN (hypertension)   . HLD (hyperlipidemia)   . Hypothyroidism   . Lung mass     LUL mass on chest CT 5/12; need repeat in 06/2011    Past Surgical History  Procedure Date  . Coronary stent placement     ROS:  As stated in the HPI and negative for all other systems.  PHYSICAL EXAM BP  126/71  Pulse 67  Resp 18  Ht 5\' 5"  (1.651 m)  Wt 120 lb (54.432 kg)  BMI 19.97 kg/m2 GENERAL:  Well appearing HEENT:  Pupils equal round and reactive, fundi not visualized, oral mucosa unremarkable NECK:  No jugular venous distention, waveform within normal limits, carotid upstroke brisk and symmetric, no bruits, no thyromegaly LYMPHATICS:  No cervical, inguinal adenopathy LUNGS:  Clear to auscultation bilaterally BACK:  No CVA tenderness CHEST:  Unremarkable HEART:  PMI not displaced or sustained,S1 and S2 within normal limits, no S3, no S4, no clicks, no rubs, no murmurs ABD:  Flat, positive bowel sounds normal in frequency in pitch, no bruits, no rebound, no guarding, no midline pulsatile mass, no hepatomegaly, no splenomegaly EXT:  2 plus pulses throughout, no edema, no cyanosis no clubbing SKIN:  No rashes no nodules NEURO:  Cranial nerves II through XII grossly intact, motor grossly intact throughout PSYCH:  Cognitively intact, oriented to person place and time   ASSESSMENT AND PLAN

## 2011-10-06 ENCOUNTER — Telehealth: Payer: Self-pay | Admitting: Cardiology

## 2011-10-06 NOTE — Telephone Encounter (Signed)
New msg Pt wants samples of effient.  Please call and let him know

## 2011-10-06 NOTE — Telephone Encounter (Signed)
Samples of Effient 10 mg #28 lot number [id number] A exp 8/13 left at front desk for pt.  Left message for pt that they are here.

## 2012-01-12 ENCOUNTER — Ambulatory Visit (INDEPENDENT_AMBULATORY_CARE_PROVIDER_SITE_OTHER): Payer: Medicare Other | Admitting: Cardiology

## 2012-01-12 ENCOUNTER — Encounter: Payer: Self-pay | Admitting: Cardiology

## 2012-01-12 VITALS — BP 160/82 | HR 65 | Ht 65.0 in | Wt 120.0 lb

## 2012-01-12 DIAGNOSIS — R918 Other nonspecific abnormal finding of lung field: Secondary | ICD-10-CM

## 2012-01-12 DIAGNOSIS — E785 Hyperlipidemia, unspecified: Secondary | ICD-10-CM

## 2012-01-12 DIAGNOSIS — R911 Solitary pulmonary nodule: Secondary | ICD-10-CM

## 2012-01-12 DIAGNOSIS — I10 Essential (primary) hypertension: Secondary | ICD-10-CM

## 2012-01-12 DIAGNOSIS — I2510 Atherosclerotic heart disease of native coronary artery without angina pectoris: Secondary | ICD-10-CM

## 2012-01-12 DIAGNOSIS — R222 Localized swelling, mass and lump, trunk: Secondary | ICD-10-CM

## 2012-01-12 NOTE — Assessment & Plan Note (Signed)
The patient has no new sypmtoms.  No further cardiovascular testing is indicated.  We will continue with aggressive risk reduction and meds as listed.  He will be able to stop the Effient at the end of May.

## 2012-01-12 NOTE — Assessment & Plan Note (Signed)
He is having this followed by Laurena Slimmer, MD with a goal LDL less than 70 and HDL greater than 40.

## 2012-01-12 NOTE — Patient Instructions (Signed)
Please stop your Effient at the end of May continue all other medications as listed.  You have been referred to Dr Sandrea Hughs in pulmonary.  Follow up in 6 months with Dr Antoine Poche.  You will receive a letter in the mail 2 months before you are due.  Please call us when you receive this letter to schedule your follow up appointment.

## 2012-01-12 NOTE — Progress Notes (Signed)
   HPI The patient presents for evaluation of CAD.  Since I last saw him he has done well.  The patient denies any new symptoms such as chest discomfort, neck or arm discomfort. There has been no new shortness of breath, PND or orthopnea. There have been no reported palpitations, presyncope or syncope.  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 With this he denies any limitations. He has none of the symptoms similar to his MI.  No Known Allergies  Current Outpatient Prescriptions  Medication Sig Dispense Refill  . aspirin 81 MG tablet Take 81 mg by mouth daily.        Marland Kitchen levothyroxine (SYNTHROID, LEVOTHROID) 112 MCG tablet Take 112 mcg by mouth daily.        Marland Kitchen losartan (COZAAR) 50 MG tablet Take 1 tablet (50 mg total) by mouth daily.  30 tablet  11  . metoprolol succinate (TOPROL-XL) 25 MG 24 hr tablet Take 25 mg by mouth daily.        . Multiple Vitamin (MULTIVITAMIN) tablet Take 1 tablet by mouth daily.        . prasugrel (EFFIENT) 10 MG TABS Take 1 tablet (10 mg total) by mouth daily.  30 tablet  6  . pravastatin (PRAVACHOL) 80 MG tablet Take 80 mg by mouth daily.         Current Facility-Administered Medications  Medication Dose Route Frequency Provider Last Rate Last Dose  . 0.9 %  sodium chloride infusion  500 mL Intravenous Continuous Mardella Layman, MD        Past Medical History  Diagnosis Date  . CAD (coronary artery disease)     a. NSTEMI 5/12: Xience DES x 2 to RCA; b. cath 5/12: mRCA 99% (tx with PCI); dLM 30-40%, pLAD 30%, D1 occluded, CFX 20-30%, EF 40-45% with mod to severe inf HK and dist Ant AK  . Ischemic cardiomyopathy     a. echo 5/12 (after PCI): EF 50-55%, AS and apical HK, grade 1 diast dysfxn, mild MR  . DM2 (diabetes mellitus, type 2)   . HTN (hypertension)   . HLD (hyperlipidemia)   . Hypothyroidism   . Lung mass     LUL mass on chest CT 5/12; need repeat in 06/2011    Past Surgical History  Procedure Date  . Coronary stent placement     ROS:  As stated in  the HPI and negative for all other systems.  PHYSICAL EXAM BP 160/82  Pulse 65  Ht 5\' 5"  (1.651 m)  Wt 120 lb (54.432 kg)  BMI 19.97 kg/m2 GENERAL:  Well appearing HEENT:  Pupils equal round and reactive, fundi not visualized, oral mucosa unremarkable NECK:  No jugular venous distention, waveform within normal limits, carotid upstroke brisk and symmetric, no bruits, no thyromegaly LYMPHATICS:  No cervical, inguinal adenopathy LUNGS:  Clear to auscultation bilaterally BACK:  No CVA tenderness CHEST:  Unremarkable HEART:  PMI not displaced or sustained,S1 and S2 within normal limits, no S3, no S4, no clicks, no rubs, no murmurs ABD:  Flat, positive bowel sounds normal in frequency in pitch, no bruits, no rebound, no guarding, no midline pulsatile mass, no hepatomegaly, no splenomegaly EXT:  2 plus pulses throughout, no edema, no cyanosis no clubbing SKIN:  No rashes no nodules NEURO:  Cranial nerves II through XII grossly intact, motor grossly intact throughout PSYCH:  Cognitively intact, oriented to person place and time   ASSESSMENT AND PLAN

## 2012-01-12 NOTE — Assessment & Plan Note (Signed)
I have referred him to pulmonary to decide whether or not we need to keep doing CTs as is being suggested by radiology.  He has a stable nodule.

## 2012-01-12 NOTE — Assessment & Plan Note (Signed)
The blood pressure is at target. No change in medications is indicated. We will continue with therapeutic lifestyle changes (TLC).  

## 2012-02-15 ENCOUNTER — Other Ambulatory Visit (INDEPENDENT_AMBULATORY_CARE_PROVIDER_SITE_OTHER): Payer: Medicare Other

## 2012-02-15 ENCOUNTER — Encounter: Payer: Self-pay | Admitting: Internal Medicine

## 2012-02-15 ENCOUNTER — Ambulatory Visit (INDEPENDENT_AMBULATORY_CARE_PROVIDER_SITE_OTHER): Payer: Medicare Other | Admitting: Internal Medicine

## 2012-02-15 VITALS — BP 126/62 | HR 66 | Temp 98.0°F | Ht 65.0 in | Wt 123.0 lb

## 2012-02-15 DIAGNOSIS — R918 Other nonspecific abnormal finding of lung field: Secondary | ICD-10-CM

## 2012-02-15 DIAGNOSIS — R222 Localized swelling, mass and lump, trunk: Secondary | ICD-10-CM

## 2012-02-15 LAB — BASIC METABOLIC PANEL
Calcium: 9.3 mg/dL (ref 8.4–10.5)
GFR: 71.46 mL/min (ref >60.00)
Potassium: 5.4 mEq/L — ABNORMAL HIGH (ref 3.5–5.1)
Sodium: 137 mEq/L (ref 135–145)

## 2012-02-15 NOTE — Progress Notes (Signed)
  Subjective:    Patient ID: Keith [name], male    DOB: 05/06/1941  MRN: [id number]  HPI  71 yo [place] male quit smoking 1998 with no respiratory issues but sign IHD/MildCHF and incidental spn nodules 02/2011 not changed 06/2011 referred 02/15/2012 to pulmonary clinic by Dr Antoine Poche   02/15/2012 1st pulmonary ov/ cc basically healthy x IHD no previous tumors, no RA or unusual exposure hx. Denies cough or limiting sob, Night sweats, cp pleuritic or exertional.  Review of Systems  Constitutional: Negative for fever, chills, activity change, appetite change and unexpected weight change.  HENT: Negative for congestion, sore throat, rhinorrhea, sneezing, trouble swallowing, dental problem, voice change and postnasal drip.   Eyes: Negative for visual disturbance.  Respiratory: Negative for cough, choking and shortness of breath.   Cardiovascular: Negative for chest pain and leg swelling.  Gastrointestinal: Negative for nausea, vomiting and abdominal pain.  Genitourinary: Negative for difficulty urinating.  Musculoskeletal: Negative for arthralgias.  Skin: Negative for rash.  Psychiatric/Behavioral: Negative for behavioral problems and confusion.       Objective:   Physical Exam Elderly [place] male very pleasant nad Wt  123 02/15/2012  HEENT mild turbinate edema.  Oropharynx no thrush or excess pnd or cobblestoning.  No JVD or cervical adenopathy. Mild accessory muscle hypertrophy. Trachea midline, nl thryroid. Chest was hyperinflated by percussion with diminished breath sounds and moderate increased exp time without wheeze. Hoover sign positive at mid inspiration. Regular rate and rhythm without murmur gallop or rub or increase P2 or edema.  Abd: no hsm, nl excursion. Ext warm without cyanosis or clubbing.         Assessment & Plan:

## 2012-02-15 NOTE — Patient Instructions (Signed)
Solitary pulmonary nodule is very hard to prove it's harmless unless we remove it which should not be necessary and we don't recommend   Best approach is a limited CT now and in again in one year and if if no growth, no further follou up  Please see patient coordinator before you leave today  to schedule limited CT LUL

## 2012-02-16 ENCOUNTER — Telehealth: Payer: Self-pay | Admitting: *Deleted

## 2012-02-16 ENCOUNTER — Ambulatory Visit (INDEPENDENT_AMBULATORY_CARE_PROVIDER_SITE_OTHER)
Admission: RE | Admit: 2012-02-16 | Discharge: 2012-02-16 | Disposition: A | Discharge status: Home / Self Care | Payer: Medicare Other | Source: Ambulatory Visit | Attending: Internal Medicine | Admitting: Internal Medicine

## 2012-02-16 ENCOUNTER — Other Ambulatory Visit: Payer: Self-pay | Admitting: Internal Medicine

## 2012-02-16 ENCOUNTER — Telehealth: Payer: Self-pay | Admitting: Internal Medicine

## 2012-02-16 ENCOUNTER — Encounter: Payer: Self-pay | Admitting: Internal Medicine

## 2012-02-16 DIAGNOSIS — R918 Other nonspecific abnormal finding of lung field: Secondary | ICD-10-CM

## 2012-02-16 DIAGNOSIS — R911 Solitary pulmonary nodule: Secondary | ICD-10-CM

## 2012-02-16 DIAGNOSIS — R222 Localized swelling, mass and lump, trunk: Secondary | ICD-10-CM

## 2012-02-16 NOTE — Progress Notes (Signed)
Quick Note:  LMTCB ______ 

## 2012-02-16 NOTE — Progress Notes (Signed)
Quick Note:  Spoke with pt and notified of results per Dr. Wert. Pt verbalized understanding and denied any questions.  ______ 

## 2012-02-16 NOTE — Telephone Encounter (Signed)
Walk-in pt   Requesting  Effient 10 mg samples. A  10 mg /28 sample pills placed at the front desk, patient aware.

## 2012-02-16 NOTE — Telephone Encounter (Signed)
Spoke with pt and notified of results per Dr. Sherene Sires. Pt verbalized understanding and denied any questions. Pt okay with PET scan being scheduled.  Order sent to [facility] and I advised the pt we will call him with results once reviewed Will forward to MW so that he is aware.

## 2012-02-16 NOTE — Telephone Encounter (Signed)
LMTCB He is needing to hear ct and lab results

## 2012-02-16 NOTE — Assessment & Plan Note (Addendum)
Although there are clearly abnormalities on CT scan, they should probably be considered "microscopic" since not obvious on plain cxr in a patient with obvious "macroscopic" health care concerns, esp cardiac.  Therefore I'm very reluctant to embark on an invasive w/u in this setting.  Discussed in detail all the  indications, usual  risks and alternatives  relative to the benefits with patient and son who agree to conservative approach with ct chest limited to the nodule now and if no growth repeat in a year.  PET scan is not a great tool in such a small lesion but could also be considered .

## 2012-02-22 ENCOUNTER — Encounter: Payer: Self-pay | Admitting: Internal Medicine

## 2012-02-22 ENCOUNTER — Encounter (HOSPITAL_COMMUNITY): Payer: Self-pay

## 2012-02-22 ENCOUNTER — Encounter (HOSPITAL_COMMUNITY)
Admission: RE | Admit: 2012-02-22 | Discharge: 2012-02-22 | Disposition: A | Discharge status: Home / Self Care | Payer: Medicare Other | Source: Ambulatory Visit | Attending: Internal Medicine | Admitting: Internal Medicine

## 2012-02-22 DIAGNOSIS — J984 Other disorders of lung: Secondary | ICD-10-CM | POA: Insufficient documentation

## 2012-02-22 DIAGNOSIS — I2510 Atherosclerotic heart disease of native coronary artery without angina pectoris: Secondary | ICD-10-CM | POA: Insufficient documentation

## 2012-02-22 DIAGNOSIS — R222 Localized swelling, mass and lump, trunk: Secondary | ICD-10-CM | POA: Insufficient documentation

## 2012-02-22 DIAGNOSIS — R911 Solitary pulmonary nodule: Secondary | ICD-10-CM

## 2012-02-22 DIAGNOSIS — I517 Cardiomegaly: Secondary | ICD-10-CM | POA: Insufficient documentation

## 2012-02-22 MED ORDER — FLUDEOXYGLUCOSE F - 18 (FDG) INJECTION
18.9000 | Freq: Once | INTRAVENOUS | Status: AC | PRN
  Administered 2012-02-22: 18.9 via INTRAVENOUS

## 2012-02-23 ENCOUNTER — Telehealth: Payer: Self-pay | Admitting: Internal Medicine

## 2012-02-23 NOTE — Telephone Encounter (Signed)
Notes Recorded by Nyoka Cowden, MD on 02/22/2012 at 5:02 PM Call patient : Study is unremarkable, no change in recs - neg for ca but the test isn't perfect so rec CT Chest in 6 months (not 3 as pre test prob is low)- I placed already in tickle file Copy to referring doc  I spoke with son and is aware of PET results and in 6 months pt will have CT scheduled. He voiced his understanding and had no questions. Copy has been sent to referring doctor.

## 2012-02-24 NOTE — Progress Notes (Signed)
Quick Note:  Pt aware, see phone note. ______ 

## 2012-02-29 ENCOUNTER — Other Ambulatory Visit: Payer: Self-pay | Admitting: *Deleted

## 2012-02-29 MED ORDER — METOPROLOL SUCCINATE ER 25 MG PO TB24: 25.0000 mg | ORAL_TABLET | Freq: Every day | ORAL | Status: DC

## 2012-04-09 ENCOUNTER — Other Ambulatory Visit: Payer: Self-pay

## 2012-04-09 ENCOUNTER — Other Ambulatory Visit: Payer: Self-pay | Admitting: Physician Assistant

## 2012-04-16 MED ORDER — PRAVASTATIN SODIUM 80 MG PO TABS: 80.0000 mg | ORAL_TABLET | Freq: Every day | ORAL | Status: DC

## 2012-07-12 ENCOUNTER — Encounter: Payer: Self-pay | Admitting: Cardiology

## 2012-07-12 ENCOUNTER — Ambulatory Visit (INDEPENDENT_AMBULATORY_CARE_PROVIDER_SITE_OTHER): Payer: Medicare Other | Admitting: Cardiology

## 2012-07-12 VITALS — BP 180/80 | HR 57 | Wt 124.8 lb

## 2012-07-12 DIAGNOSIS — K644 Residual hemorrhoidal skin tags: Secondary | ICD-10-CM

## 2012-07-12 DIAGNOSIS — E785 Hyperlipidemia, unspecified: Secondary | ICD-10-CM

## 2012-07-12 DIAGNOSIS — I219 Acute myocardial infarction, unspecified: Secondary | ICD-10-CM

## 2012-07-12 DIAGNOSIS — I2510 Atherosclerotic heart disease of native coronary artery without angina pectoris: Secondary | ICD-10-CM

## 2012-07-12 DIAGNOSIS — I214 Non-ST elevation (NSTEMI) myocardial infarction: Secondary | ICD-10-CM

## 2012-07-12 DIAGNOSIS — I10 Essential (primary) hypertension: Secondary | ICD-10-CM

## 2012-07-12 DIAGNOSIS — R001 Bradycardia, unspecified: Secondary | ICD-10-CM

## 2012-07-12 DIAGNOSIS — I498 Other specified cardiac arrhythmias: Secondary | ICD-10-CM

## 2012-07-12 NOTE — Progress Notes (Signed)
HPI The patient presents for evaluation of CAD.  Since I last saw him he has done well.  The patient denies any new symptoms such as chest discomfort, neck or arm discomfort. There has been no new shortness of breath, PND or orthopnea. There have been no reported palpitations, presyncope or syncope.  He says that he is walking for exercise. He stop the Effient last year after 12 months of therapy.  No Known Allergies  Current Outpatient Prescriptions  Medication Sig Dispense Refill  . aspirin 81 MG tablet Take 81 mg by mouth daily.        Marland Kitchen levothyroxine (SYNTHROID, LEVOTHROID) 112 MCG tablet Take 112 mcg by mouth daily.        Marland Kitchen losartan (COZAAR) 50 MG tablet TAKE ONE TABLET BY MOUTH EVERY DAY  30 tablet  2  . metoprolol succinate (TOPROL-XL) 25 MG 24 hr tablet Take 1 tablet (25 mg total) by mouth daily.  30 tablet  6  . Multiple Vitamin (MULTIVITAMIN) tablet Take 1 tablet by mouth daily.        . pravastatin (PRAVACHOL) 80 MG tablet Take 1 tablet (80 mg total) by mouth daily.  30 tablet  4   Current Facility-Administered Medications  Medication Dose Route Frequency Provider Last Rate Last Dose  . 0.9 %  sodium chloride infusion  500 mL Intravenous Continuous Mardella Layman, MD        Past Medical History  Diagnosis Date  . CAD (coronary artery disease)     a. NSTEMI 5/12: Xience DES x 2 to RCA; b. cath 5/12: mRCA 99% (tx with PCI); dLM 30-40%, pLAD 30%, D1 occluded, CFX 20-30%, EF 40-45% with mod to severe inf HK and dist Ant AK  . Ischemic cardiomyopathy     a. echo 5/12 (after PCI): EF 50-55%, AS and apical HK, grade 1 diast dysfxn, mild MR  . DM2 (diabetes mellitus, type 2)   . HTN (hypertension)   . HLD (hyperlipidemia)   . Hypothyroidism   . Lung mass     LUL mass on chest CT 5/12; need repeat in 06/2011    Past Surgical History  Procedure Date  . Coronary stent placement    ROS:  As stated in the HPI and negative for all other systems.  PHYSICAL EXAM BP 180/80   Pulse 57  Wt 124 lb 12.8 oz (56.609 kg) GENERAL:  Well appearing HEENT:  Pupils equal round and reactive, fundi not visualized, oral mucosa unremarkable NECK:  No jugular venous distention, waveform within normal limits, carotid upstroke brisk and symmetric, no bruits, no thyromegaly LYMPHATICS:  No cervical, inguinal adenopathy LUNGS:  Clear to auscultation bilaterally BACK:  No CVA tenderness CHEST:  Unremarkable HEART:  PMI not displaced or sustained,S1 and S2 within normal limits, no S3, no S4, no clicks, no rubs, no murmurs ABD:  Flat, positive bowel sounds normal in frequency in pitch, no bruits, no rebound, no guarding, no midline pulsatile mass, no hepatomegaly, no splenomegaly EXT:  2 plus pulses throughout, no edema, no cyanosis no clubbing SKIN:  No rashes no nodules NEURO:  Cranial nerves II through XII grossly intact, motor grossly intact throughout PSYCH:  Cognitively intact, oriented to person place and time  EKG:  Sinus bradycardia, rate 57, axis within normal limits, intervals within normal limits, no acute ST-T wave changes. 07/12/2012  ASSESSMENT AND PLAN  CAD (coronary artery disease) -  The patient has no new sypmtoms. No further cardiovascular testing is indicated. We  will continue with aggressive risk reduction and meds as listed.  HLD (hyperlipidemia) -  He is having this followed by Laurena Slimmer, MD with a goal LDL less than 70 and HDL greater than 40.   HTN (hypertension) -  Although his blood pressure is elevated today it is in the 130s systolic at home.  No change in therapy is indicated.   Lung mass -  This is followed by Dr. Sherene Sires with a planned CT in Nov.

## 2012-07-12 NOTE — Patient Instructions (Signed)
The current medical regimen is effective;  continue present plan and medications.  Follow up in 1 year with Dr Hochrein.  You will receive a letter in the mail 2 months before you are due.  Please call us when you receive this letter to schedule your follow up appointment.  

## 2012-07-18 ENCOUNTER — Other Ambulatory Visit: Payer: Self-pay | Admitting: *Deleted

## 2012-07-18 MED ORDER — LOSARTAN POTASSIUM 50 MG PO TABS: 50.0000 mg | ORAL_TABLET | Freq: Every day | ORAL | Status: DC

## 2012-08-15 ENCOUNTER — Telehealth: Payer: Self-pay | Admitting: *Deleted

## 2012-08-15 DIAGNOSIS — R911 Solitary pulmonary nodule: Secondary | ICD-10-CM

## 2012-08-15 NOTE — Telephone Encounter (Signed)
LMOM for son Ajay to return my call

## 2012-08-15 NOTE — Telephone Encounter (Signed)
Message copied by Christen Butter on Wed Aug 15, 2012  9:41 AM ------      Message from: Keith [name]      Created: Wed Feb 22, 2012  5:01 PM       Needs ct chest limited to nodule, no contrast

## 2012-08-15 NOTE — Telephone Encounter (Signed)
Pt's son, Viviano Simas, returned Leslie's call & asked to be reached at [phone].  Antionette Fairy

## 2012-08-15 NOTE — Telephone Encounter (Signed)
Spoke with Ajay and notified that it is time to have pt's ct chest.  He verbalized understanding  Order was sent to [facility]

## 2012-09-06 ENCOUNTER — Encounter: Payer: Self-pay | Admitting: Internal Medicine

## 2012-09-06 ENCOUNTER — Ambulatory Visit (INDEPENDENT_AMBULATORY_CARE_PROVIDER_SITE_OTHER)
Admission: RE | Admit: 2012-09-06 | Discharge: 2012-09-06 | Disposition: A | Discharge status: Home / Self Care | Payer: Medicare Other | Source: Ambulatory Visit | Attending: Internal Medicine | Admitting: Internal Medicine

## 2012-09-06 DIAGNOSIS — R911 Solitary pulmonary nodule: Secondary | ICD-10-CM

## 2012-09-14 ENCOUNTER — Telehealth: Payer: Self-pay | Admitting: *Deleted

## 2012-09-14 NOTE — Telephone Encounter (Signed)
Message copied by Christen Butter on Fri Sep 14, 2012  4:23 PM ------      Message from: Sandrea Hughs B      Created: Wed Sep 12, 2012  9:01 PM       Did we do this?      ----- Message -----         From: Nyoka Cowden, MD         Sent: 09/06/2012   1:05 PM           To: Nyoka Cowden, MD            Let him know ct fine, just scar tissue, nothing further needs to be done.  Copy to primary

## 2012-09-14 NOTE — Telephone Encounter (Signed)
I never got a result until now [facility] for pt's son Keith [name]

## 2012-09-17 NOTE — Telephone Encounter (Signed)
Pt's son, Viviano Simas, returned Leslie's call & can be reached at [phone].  Antionette Fairy

## 2012-09-17 NOTE — Telephone Encounter (Signed)
Spoke with pt's son Viviano Simas and notified of ct chest results. He verbalized understanding and states nothing further needed. Copy faxed to Dr. Chestine Spore.

## 2013-06-25 ENCOUNTER — Ambulatory Visit: Payer: Medicare Other | Admitting: Cardiology

## 2013-06-26 ENCOUNTER — Ambulatory Visit (INDEPENDENT_AMBULATORY_CARE_PROVIDER_SITE_OTHER): Payer: Medicare Other | Admitting: Cardiology

## 2013-06-26 ENCOUNTER — Encounter: Payer: Self-pay | Admitting: Cardiology

## 2013-06-26 VITALS — BP 162/72 | HR 77 | Ht 65.0 in | Wt 125.0 lb

## 2013-06-26 DIAGNOSIS — I2510 Atherosclerotic heart disease of native coronary artery without angina pectoris: Secondary | ICD-10-CM

## 2013-06-26 NOTE — Patient Instructions (Addendum)
The current medical regimen is effective;  continue present plan and medications.  Follow up in 1 year with Dr Hochrein.  You will receive a letter in the mail 2 months before you are due.  Please call us when you receive this letter to schedule your follow up appointment.  

## 2013-06-26 NOTE — Progress Notes (Signed)
HPI The patient presents for evaluation of CAD.  Since I last saw him he has done well.  The patient denies any new symptoms such as chest discomfort, neck or arm discomfort. There has been no new shortness of breath, PND or orthopnea. There have been no reported palpitations, presyncope or syncope.  He says that he is walking for exercise daily.   No Known Allergies  Current Outpatient Prescriptions  Medication Sig Dispense Refill  . aspirin 81 MG tablet Take 81 mg by mouth daily.        Marland Kitchen levothyroxine (SYNTHROID, LEVOTHROID) 112 MCG tablet Take 112 mcg by mouth daily.        Marland Kitchen losartan (COZAAR) 50 MG tablet Take 1 tablet (50 mg total) by mouth daily.  90 tablet  3  . metoprolol succinate (TOPROL-XL) 25 MG 24 hr tablet Take 50 mg by mouth daily.      . Multiple Vitamin (MULTIVITAMIN) tablet Take 1 tablet by mouth daily.        . pravastatin (PRAVACHOL) 80 MG tablet Take 1 tablet (80 mg total) by mouth daily.  30 tablet  4   Current Facility-Administered Medications  Medication Dose Route Frequency Provider Last Rate Last Dose  . 0.9 %  sodium chloride infusion  500 mL Intravenous Continuous Mardella Layman, MD        Past Medical History  Diagnosis Date  . CAD (coronary artery disease)     a. NSTEMI 5/12: Xience DES x 2 to RCA; b. cath 5/12: mRCA 99% (tx with PCI); dLM 30-40%, pLAD 30%, D1 occluded, CFX 20-30%, EF 40-45% with mod to severe inf HK and dist Ant AK  . Ischemic cardiomyopathy     a. echo 5/12 (after PCI): EF 50-55%, AS and apical HK, grade 1 diast dysfxn, mild MR  . DM2 (diabetes mellitus, type 2)   . HTN (hypertension)   . HLD (hyperlipidemia)   . Hypothyroidism   . Lung mass     LUL mass on chest CT 5/12; need repeat in 06/2011    Past Surgical History  Procedure Laterality Date  . Coronary stent placement     ROS:  As stated in the HPI and negative for all other systems.  PHYSICAL EXAM BP 162/72  Pulse 77  Ht 5\' 5"  (1.651 m)  Wt 125 lb (56.7 kg)  BMI  20.8 kg/m2 GENERAL:  Well appearing NECK:  No jugular venous distention, waveform within normal limits, carotid upstroke brisk and symmetric, no bruits, no thyromegaly LUNGS:  Clear to auscultation bilaterally BACK:  No CVA tenderness HEART:  PMI not displaced or sustained,S1 and S2 within normal limits, no S3, no S4, no clicks, no rubs, no murmurs ABD:  Flat, positive bowel sounds normal in frequency in pitch, no bruits, no rebound, no guarding, no midline pulsatile mass, no hepatomegaly, no splenomegaly EXT:  2 plus pulses throughout, no edema, no cyanosis no clubbing  EKG:  Sinus bradycardia, rate 77, axis within normal limits, intervals within normal limits, no acute ST-T wave changes and PACs. 07/12/2012  ASSESSMENT AND PLAN  CAD (coronary artery disease) -  The patient has no new sypmtoms. No further cardiovascular testing is indicated. We will continue with aggressive risk reduction and meds as listed.  HLD (hyperlipidemia) -  He is having this followed by Laurena Slimmer, MD and he reports that he is at target  HTN (hypertension) -  Although his blood pressure is elevated today it is in the 130s systolic  at home.  No change in therapy is indicated.

## 2013-10-24 HISTORY — PX: OTHER SURGICAL HISTORY: SHX169

## 2014-07-02 ENCOUNTER — Ambulatory Visit (INDEPENDENT_AMBULATORY_CARE_PROVIDER_SITE_OTHER): Payer: Medicare Other | Admitting: Cardiology

## 2014-07-02 ENCOUNTER — Encounter: Payer: Self-pay | Admitting: Cardiology

## 2014-07-02 VITALS — BP 120/60 | HR 67 | Ht 65.0 in | Wt 130.5 lb

## 2014-07-02 DIAGNOSIS — I214 Non-ST elevation (NSTEMI) myocardial infarction: Secondary | ICD-10-CM

## 2014-07-02 DIAGNOSIS — I2510 Atherosclerotic heart disease of native coronary artery without angina pectoris: Secondary | ICD-10-CM

## 2014-07-02 DIAGNOSIS — I10 Essential (primary) hypertension: Secondary | ICD-10-CM

## 2014-07-02 DIAGNOSIS — I429 Cardiomyopathy, unspecified: Secondary | ICD-10-CM

## 2014-07-02 DIAGNOSIS — I428 Other cardiomyopathies: Secondary | ICD-10-CM

## 2014-07-02 DIAGNOSIS — K644 Residual hemorrhoidal skin tags: Secondary | ICD-10-CM

## 2014-07-02 NOTE — Patient Instructions (Signed)
Your physician recommends that you schedule a follow-up appointment in:  One year with Dr.Hochrein  We are ordering a treadmill test

## 2014-07-02 NOTE — Progress Notes (Signed)
HPI The patient presents for evaluation of CAD.  Since I last saw him he has done well.  The patient denies any new symptoms such as chest discomfort, neck or arm discomfort. There has been no new shortness of breath, PND or orthopnea. There have been no reported palpitations, presyncope or syncope.  He says that he is walking for exercise daily.   He also works at his [employer].  No Known Allergies  Current Outpatient Prescriptions  Medication Sig Dispense Refill  . aspirin 81 MG tablet Take 81 mg by mouth daily.        Marland Kitchen levothyroxine (SYNTHROID, LEVOTHROID) 112 MCG tablet Take 100 mcg by mouth daily.       Marland Kitchen losartan (COZAAR) 50 MG tablet Take 100 mg by mouth daily.      . metoprolol succinate (TOPROL-XL) 25 MG 24 hr tablet Take 50 mg by mouth daily.      . Multiple Vitamin (MULTIVITAMIN) tablet Take 1 tablet by mouth daily.        . pravastatin (PRAVACHOL) 80 MG tablet Take 1 tablet (80 mg total) by mouth daily.  30 tablet  4   Current Facility-Administered Medications  Medication Dose Route Frequency Provider Last Rate Last Dose  . 0.9 %  sodium chloride infusion  500 mL Intravenous Continuous Sable Feil, MD        Past Medical History  Diagnosis Date  . CAD (coronary artery disease)     a. NSTEMI 5/12: Xience DES x 2 to RCA; b. cath 5/12: mRCA 99% (tx with PCI); dLM 30-40%, pLAD 30%, D1 occluded, CFX 20-30%, EF 40-45% with mod to severe inf HK and dist Ant AK  . Ischemic cardiomyopathy     a. echo 5/12 (after PCI): EF 50-55%, AS and apical HK, grade 1 diast dysfxn, mild MR  . DM2 (diabetes mellitus, type 2)   . HTN (hypertension)   . HLD (hyperlipidemia)   . Hypothyroidism   . Lung mass     LUL mass on chest CT 5/12; need repeat in 06/2011    Past Surgical History  Procedure Laterality Date  . Coronary stent placement     ROS:  As stated in the HPI and negative for all other systems.  PHYSICAL EXAM BP 120/60  Pulse 67  Ht 5\' 5"  (1.651 m)  Wt  130 lb 8 oz (59.194 kg)  BMI 21.72 kg/m2 GENERAL:  Well appearing NECK:  No jugular venous distention, waveform within normal limits, carotid upstroke brisk and symmetric, no bruits, no thyromegaly LUNGS:  Clear to auscultation bilaterally BACK:  No CVA tenderness HEART:  PMI not displaced or sustained,S1 and S2 within normal limits, no S3, no S4, no clicks, no rubs, no murmurs ABD:  Flat, positive bowel sounds normal in frequency in pitch, no bruits, no rebound, no guarding, no midline pulsatile mass, no hepatomegaly, no splenomegaly EXT:  2 plus pulses throughout, no edema, no cyanosis no clubbing  EKG:  Sinus bradycardia, rate 67, axis within normal limits, intervals within normal limits, no acute ST-T wave changes and PACs. 07/12/2012  ASSESSMENT AND PLAN  CAD (coronary artery disease) -  I will bring the patient back for a POET (Plain Old Exercise Test). This will allow me to screen for obstructive coronary disease, risk stratify and very importantly provide a prescription for exercise.  HLD (hyperlipidemia) -  He is having this followed by Foye Spurling, MD and he reports that he is at target  HTN (hypertension) -  The blood pressure is at target. No change in medications is indicated. We will continue with therapeutic lifestyle changes (TLC).

## 2014-07-03 ENCOUNTER — Encounter: Payer: Self-pay | Admitting: Cardiology

## 2014-07-11 ENCOUNTER — Telehealth (HOSPITAL_COMMUNITY): Payer: Self-pay

## 2014-07-11 NOTE — Telephone Encounter (Signed)
Encounter complete. 

## 2014-07-16 ENCOUNTER — Ambulatory Visit (HOSPITAL_COMMUNITY)
Admission: RE | Admit: 2014-07-16 | Discharge: 2014-07-16 | Disposition: A | Discharge status: Home / Self Care | Payer: Medicare Other | Source: Ambulatory Visit | Attending: Cardiology | Admitting: Cardiology

## 2014-07-16 DIAGNOSIS — I428 Other cardiomyopathies: Secondary | ICD-10-CM | POA: Diagnosis not present

## 2014-07-16 DIAGNOSIS — I429 Cardiomyopathy, unspecified: Secondary | ICD-10-CM

## 2014-07-16 NOTE — Procedures (Signed)
Exercise Treadmill Test  Pre-Exercise Testing Evaluation   Test  Exercise Tolerance Test Ordering MD: [name], MD  Interpreting MD:   Unique Test No:1  Treadmill:  1  Indication for ETT: Stent Patency  Contraindication to ETT: No   Stress Modality: exercise - treadmill  Cardiac Imaging Performed: non   Protocol: standard Bruce - maximal  Max BP: 195/65  Max MPHR (bpm):  146 85% MPR (bpm): 124  MPHR obtained (bpm):  136 % MPHR obtained: 93  Reached 85% MPHR (min:sec): 3:05 Total Exercise Time (min-sec):  6:01  Workload in METS:  7.00 Borg Scale: Not recorded  Reason ETT Terminated:  fatigue    ST Segment Analysis At Rest: normal ST segments - no evidence of significant ST depression With Exercise: significant ischemic ST depression  Other Information Arrhythmia:  No Angina during ETT:  absent (0) Quality of ETT:  diagnostic  ETT Interpretation:  abnormal - evidence of ST depression consistent with ischemia  Comments: The patient had an moderate exercise tolerance.  There was no chest pain.  There was an appropriate level of dyspnea.  There were no arrhythmias, a normal heart rate response and normal BP response.  However, he did have ischemic ST T wave changes in stage I.     Recommendations: I will discuss with him the need for cardiac cath.

## 2014-07-18 ENCOUNTER — Telehealth: Payer: Self-pay | Admitting: Cardiology

## 2014-07-18 NOTE — Telephone Encounter (Signed)
Pt needed an appt,a message was not needed.

## 2014-07-21 ENCOUNTER — Encounter: Payer: Self-pay | Admitting: Cardiology

## 2014-07-22 ENCOUNTER — Encounter (HOSPITAL_COMMUNITY): Payer: Self-pay | Admitting: Pharmacist

## 2014-07-22 ENCOUNTER — Telehealth: Payer: Self-pay | Admitting: Cardiology

## 2014-07-22 ENCOUNTER — Telehealth: Payer: Self-pay | Admitting: *Deleted

## 2014-07-22 DIAGNOSIS — R5383 Other fatigue: Secondary | ICD-10-CM

## 2014-07-22 DIAGNOSIS — D689 Coagulation defect, unspecified: Secondary | ICD-10-CM

## 2014-07-22 DIAGNOSIS — Z79899 Other long term (current) drug therapy: Secondary | ICD-10-CM

## 2014-07-22 DIAGNOSIS — R5381 Other malaise: Secondary | ICD-10-CM

## 2014-07-22 LAB — CBC
HCT: 44.6 % (ref 39.0–52.0)
Hemoglobin: 15.6 g/dL (ref 13.0–17.0)
MCH: 32.2 pg (ref 26.0–34.0)
MCHC: 35 g/dL (ref 30.0–36.0)
MCV: 92 fL (ref 78.0–100.0)
PLATELETS: 283 10*3/uL (ref 150–400)
RBC: 4.85 MIL/uL (ref 4.22–5.81)
RDW: 13.7 % (ref 11.5–15.5)
WBC: 5.8 10*3/uL (ref 4.0–10.5)

## 2014-07-22 LAB — BASIC METABOLIC PANEL
BUN: 11 mg/dL (ref 6–23)
CHLORIDE: 102 meq/L (ref 96–112)
CO2: 26 meq/L (ref 19–32)
CREATININE: 1.08 mg/dL (ref 0.50–1.35)
Calcium: 9.8 mg/dL (ref 8.4–10.5)
Glucose, Bld: 61 mg/dL — ABNORMAL LOW (ref 70–99)
Potassium: 4.4 mEq/L (ref 3.5–5.3)
Sodium: 138 mEq/L (ref 135–145)

## 2014-07-22 LAB — TSH: TSH: 3.449 u[IU]/mL (ref 0.350–4.500)

## 2014-07-22 NOTE — Telephone Encounter (Signed)
[facility] called - patient needs labs drawn but no order is in Epic.  Patient is scheduled for Cardiac Cath - pre-procedure labs ordered.

## 2014-07-22 NOTE — Telephone Encounter (Signed)
[facility] called stating patient showed up for labs to be drawn - no orders in Epic.  Patient scheduled for Cardiac Cath - order placed for pre-procedure labs.

## 2014-07-23 LAB — APTT: aPTT: 29 seconds (ref 24–37)

## 2014-07-23 LAB — PROTIME-INR
INR: 0.97 (ref <1.50)
PROTHROMBIN TIME: 12.9 s (ref 11.6–15.2)

## 2014-07-23 NOTE — Telephone Encounter (Signed)
Orders to be done by [place] for procedure.

## 2014-07-28 ENCOUNTER — Encounter (HOSPITAL_COMMUNITY)
Admission: RE | Disposition: A | Discharge status: Home / Self Care | Payer: Medicare Other | Source: Ambulatory Visit | Attending: Cardiology

## 2014-07-28 ENCOUNTER — Encounter (HOSPITAL_COMMUNITY): Payer: Self-pay | Admitting: General Practice

## 2014-07-28 ENCOUNTER — Other Ambulatory Visit: Payer: Self-pay | Admitting: *Deleted

## 2014-07-28 ENCOUNTER — Ambulatory Visit (HOSPITAL_COMMUNITY)
Admission: RE | Admit: 2014-07-28 | Discharge: 2014-07-29 | Disposition: A | Discharge status: Home / Self Care | Payer: Medicare Other | Source: Ambulatory Visit | Attending: Cardiology | Admitting: Cardiology

## 2014-07-28 DIAGNOSIS — R079 Chest pain, unspecified: Secondary | ICD-10-CM | POA: Diagnosis present

## 2014-07-28 DIAGNOSIS — Z79899 Other long term (current) drug therapy: Secondary | ICD-10-CM | POA: Diagnosis not present

## 2014-07-28 DIAGNOSIS — R9439 Abnormal result of other cardiovascular function study: Secondary | ICD-10-CM | POA: Diagnosis present

## 2014-07-28 DIAGNOSIS — I10 Essential (primary) hypertension: Secondary | ICD-10-CM | POA: Diagnosis not present

## 2014-07-28 DIAGNOSIS — D509 Iron deficiency anemia, unspecified: Secondary | ICD-10-CM | POA: Diagnosis not present

## 2014-07-28 DIAGNOSIS — I25119 Atherosclerotic heart disease of native coronary artery with unspecified angina pectoris: Secondary | ICD-10-CM

## 2014-07-28 DIAGNOSIS — I2510 Atherosclerotic heart disease of native coronary artery without angina pectoris: Secondary | ICD-10-CM | POA: Insufficient documentation

## 2014-07-28 DIAGNOSIS — Z7982 Long term (current) use of aspirin: Secondary | ICD-10-CM | POA: Insufficient documentation

## 2014-07-28 DIAGNOSIS — E785 Hyperlipidemia, unspecified: Secondary | ICD-10-CM | POA: Diagnosis not present

## 2014-07-28 DIAGNOSIS — E039 Hypothyroidism, unspecified: Secondary | ICD-10-CM | POA: Diagnosis present

## 2014-07-28 DIAGNOSIS — Z23 Encounter for immunization: Secondary | ICD-10-CM | POA: Insufficient documentation

## 2014-07-28 DIAGNOSIS — E119 Type 2 diabetes mellitus without complications: Secondary | ICD-10-CM | POA: Diagnosis not present

## 2014-07-28 DIAGNOSIS — Z955 Presence of coronary angioplasty implant and graft: Secondary | ICD-10-CM

## 2014-07-28 HISTORY — PX: LEFT HEART CATHETERIZATION WITH CORONARY ANGIOGRAM: SHX5451

## 2014-07-28 LAB — GLUCOSE, CAPILLARY
GLUCOSE-CAPILLARY: 119 mg/dL — AB (ref 70–99)
Glucose-Capillary: 153 mg/dL — ABNORMAL HIGH (ref 70–99)
Glucose-Capillary: 175 mg/dL — ABNORMAL HIGH (ref 70–99)
Glucose-Capillary: 149 mg/dL — ABNORMAL HIGH (ref 70–99)

## 2014-07-28 LAB — CK TOTAL AND CKMB (NOT AT ARMC)
CK, MB: 1.7 ng/mL (ref 0.3–4.0)
RELATIVE INDEX: 1.4 (ref 0.0–2.5)
Total CK: 122 U/L (ref 7–232)

## 2014-07-28 LAB — POCT ACTIVATED CLOTTING TIME: ACTIVATED CLOTTING TIME: 473 s

## 2014-07-28 SURGERY — LEFT HEART CATHETERIZATION WITH CORONARY ANGIOGRAM
Anesthesia: LOCAL

## 2014-07-28 MED ORDER — NITROGLYCERIN 1 MG/10 ML FOR IR/CATH LAB
INTRA_ARTERIAL | Status: AC
  Filled 2014-07-28: qty 10

## 2014-07-28 MED ORDER — ASPIRIN 81 MG PO CHEW
81.0000 mg | CHEWABLE_TABLET | Freq: Every day | ORAL | Status: DC
  Administered 2014-07-29: 11:00:00 81 mg via ORAL
  Filled 2014-07-28: qty 1

## 2014-07-28 MED ORDER — GLIMEPIRIDE 1 MG PO TABS
1.0000 mg | ORAL_TABLET | Freq: Every day | ORAL | Status: DC
  Administered 2014-07-29: 08:00:00 1 mg via ORAL
  Filled 2014-07-28 (×2): qty 1

## 2014-07-28 MED ORDER — SODIUM CHLORIDE 0.9 % IJ SOLN: 3.0000 mL | INTRAMUSCULAR | Status: DC | PRN

## 2014-07-28 MED ORDER — VERAPAMIL HCL 2.5 MG/ML IV SOLN
INTRAVENOUS | Status: AC
  Filled 2014-07-28: qty 2

## 2014-07-28 MED ORDER — ADENOSINE 12 MG/4ML IV SOLN
12.0000 mL | Freq: Once | INTRAVENOUS | Status: DC
  Filled 2014-07-28: qty 12

## 2014-07-28 MED ORDER — INSULIN ASPART 100 UNIT/ML ~~LOC~~ SOLN: 0.0000 [IU] | Freq: Every day | SUBCUTANEOUS | Status: DC

## 2014-07-28 MED ORDER — SODIUM CHLORIDE 0.9 % IV SOLN: 250.0000 mL | INTRAVENOUS | Status: DC | PRN

## 2014-07-28 MED ORDER — FENTANYL CITRATE 0.05 MG/ML IJ SOLN
INTRAMUSCULAR | Status: AC
  Filled 2014-07-28: qty 2

## 2014-07-28 MED ORDER — ONDANSETRON HCL 4 MG/2ML IJ SOLN: 4.0000 mg | Freq: Four times a day (QID) | INTRAMUSCULAR | Status: DC | PRN

## 2014-07-28 MED ORDER — MIDAZOLAM HCL 2 MG/2ML IJ SOLN
INTRAMUSCULAR | Status: AC
  Filled 2014-07-28: qty 2

## 2014-07-28 MED ORDER — PRAVASTATIN SODIUM 80 MG PO TABS
80.0000 mg | ORAL_TABLET | Freq: Every evening | ORAL | Status: DC
  Administered 2014-07-28: 80 mg via ORAL
  Filled 2014-07-28 (×2): qty 1

## 2014-07-28 MED ORDER — INSULIN ASPART 100 UNIT/ML ~~LOC~~ SOLN
0.0000 [IU] | Freq: Three times a day (TID) | SUBCUTANEOUS | Status: DC
  Administered 2014-07-29: 2 [IU] via SUBCUTANEOUS

## 2014-07-28 MED ORDER — METOPROLOL SUCCINATE ER 50 MG PO TB24
50.0000 mg | ORAL_TABLET | Freq: Every day | ORAL | Status: DC
  Administered 2014-07-29: 50 mg via ORAL
  Filled 2014-07-28: qty 1

## 2014-07-28 MED ORDER — ACETAMINOPHEN 325 MG PO TABS: 650.0000 mg | ORAL_TABLET | ORAL | Status: DC | PRN

## 2014-07-28 MED ORDER — SODIUM CHLORIDE 0.9 % IV SOLN
1.0000 mL/kg/h | INTRAVENOUS | Status: AC
  Administered 2014-07-28: 12:00:00 1 mL/kg/h via INTRAVENOUS

## 2014-07-28 MED ORDER — BIVALIRUDIN 250 MG IV SOLR
INTRAVENOUS | Status: AC
  Filled 2014-07-28: qty 250

## 2014-07-28 MED ORDER — LEVOTHYROXINE SODIUM 100 MCG PO TABS
100.0000 ug | ORAL_TABLET | Freq: Every day | ORAL | Status: DC
  Administered 2014-07-29: 100 ug via ORAL
  Filled 2014-07-28 (×2): qty 1

## 2014-07-28 MED ORDER — LIDOCAINE HCL (PF) 1 % IJ SOLN
INTRAMUSCULAR | Status: AC
  Filled 2014-07-28: qty 30

## 2014-07-28 MED ORDER — ASPIRIN 81 MG PO CHEW: 81.0000 mg | CHEWABLE_TABLET | ORAL | Status: DC

## 2014-07-28 MED ORDER — INFLUENZA VAC SPLIT QUAD 0.5 ML IM SUSY
0.5000 mL | PREFILLED_SYRINGE | Freq: Once | INTRAMUSCULAR | Status: AC
  Administered 2014-07-28: 0.5 mL via INTRAMUSCULAR
  Filled 2014-07-28: qty 0.5

## 2014-07-28 MED ORDER — LOSARTAN POTASSIUM 50 MG PO TABS
100.0000 mg | ORAL_TABLET | Freq: Every day | ORAL | Status: DC
  Administered 2014-07-29: 11:00:00 100 mg via ORAL
  Filled 2014-07-28: qty 2

## 2014-07-28 MED ORDER — SODIUM CHLORIDE 0.9 % IJ SOLN: 3.0000 mL | Freq: Two times a day (BID) | INTRAMUSCULAR | Status: DC

## 2014-07-28 MED ORDER — ASPIRIN 81 MG PO CHEW
CHEWABLE_TABLET | ORAL | Status: AC
  Administered 2014-07-28: 81 mg
  Filled 2014-07-28: qty 1

## 2014-07-28 MED ORDER — CLOPIDOGREL BISULFATE 75 MG PO TABS
75.0000 mg | ORAL_TABLET | Freq: Every day | ORAL | Status: DC
  Administered 2014-07-29: 75 mg via ORAL

## 2014-07-28 MED ORDER — HEPARIN (PORCINE) IN NACL 2-0.9 UNIT/ML-% IJ SOLN
INTRAMUSCULAR | Status: AC
  Filled 2014-07-28: qty 1500

## 2014-07-28 MED ORDER — HEPARIN SODIUM (PORCINE) 1000 UNIT/ML IJ SOLN
INTRAMUSCULAR | Status: AC
  Filled 2014-07-28: qty 1

## 2014-07-28 MED ORDER — SODIUM CHLORIDE 0.9 % IV SOLN
INTRAVENOUS | Status: DC
  Administered 2014-07-28: 10:00:00 via INTRAVENOUS

## 2014-07-28 NOTE — Interval H&P Note (Signed)
History and Physical Interval Note:  07/28/2014 10:18 AM  Keith [name]  has presented today for surgery, with the diagnosis of abnormal nuc  The various methods of treatment have been discussed with the patient and family. After consideration of risks, benefits and other options for treatment, the patient has consented to  Procedure(s): LEFT HEART CATHETERIZATION WITH CORONARY ANGIOGRAM (N/A) as a surgical intervention .  The patient's history has been reviewed, patient examined, no change in status, stable for surgery.  I have reviewed the patient's chart and labs.  Questions were answered to the patient's satisfaction.    Cath Lab Visit (complete for each Cath Lab visit)  Clinical Evaluation Leading to the Procedure:   ACS: No.  Non-ACS:    Anginal Classification: No Symptoms  Anti-ischemic medical therapy: Minimal Therapy (1 class of medications)  Non-Invasive Test Results: High-risk stress test findings: cardiac mortality >3%/year  Prior CABG: No previous CABG       Keith [name] [facility] 07/28/2014 10:18 AM

## 2014-07-28 NOTE — Research (Signed)
Bioflow Informed Consent   Subject Name: Keith [name]  Subject met inclusion and exclusion criteria.  The informed consent form, study requirements and expectations were reviewed with the subject and questions and concerns were addressed prior to the signing of the consent form.  The subject verbalized understanding of the trail requirements.  The subject agreed to participate in the Bioflow trial and signed the informed consent.  The informed consent was obtained prior to performance of any protocol-specific procedures for the subject.  A copy of the signed informed consent was given to the subject and a copy was placed in the subject's medical record.  Sandie Ano 07/28/2014, 8:47

## 2014-07-28 NOTE — Care Management Note (Addendum)
  Page 2 of 2   07/28/2014     4:32:07 PM CARE MANAGEMENT NOTE 07/28/2014  Patient:  Keith [name], Keith [name]   Account Number:  [id number]  Date Initiated:  07/28/2014  Documentation initiated by:  Samanatha Brammer  Subjective/Objective Assessment:   LEFT HEART CATHETERIZATION WITH CORONARY ANGIOGRAM 07/28/2014     Action/Plan:   CM to follow for disposition needs   Anticipated DC Date:  07/29/2014   Anticipated DC Plan:  HOME/SELF CARE         Choice offered to / List presented to:             Status of service:  Completed, signed off Medicare Important Message given?   (If response is "NO", the following Medicare IM given date fields will be blank) Date Medicare IM given:   Medicare IM given by:   Date Additional Medicare IM given:   Additional Medicare IM given by:    Discharge Disposition:  HOME/SELF CARE  Per UR Regulation:    If discussed at Long Length of Stay Meetings, dates discussed:    Comments:  Donnae Michels RN, BSN, MSHL, CCM  Nurse - Case Manager,  (Unit [phone]  07/28/2014 LEFT HEART CATHETERIZATION WITH CORONARY ANGIOGRAM 07/28/2014 Med Review:  clopidogrel (PLAVIX) tablet 75 mg Dispo Plan:  Home / Self care.   Pharmacy [id number] [address] [place], [place] [address] [phone] $11.87 per #30 with [employer] membership card Sam's list provided to patient.       Nathalie, [place], [place] [address] [phone]

## 2014-07-28 NOTE — H&P (View-Only) (Signed)
HPI The patient presents for evaluation of CAD.  Since I last saw him he has done well.  The patient denies any new symptoms such as chest discomfort, neck or arm discomfort. There has been no new shortness of breath, PND or orthopnea. There have been no reported palpitations, presyncope or syncope.  He says that he is walking for exercise daily.   He also works at his [employer].  No Known Allergies  Current Outpatient Prescriptions  Medication Sig Dispense Refill  . aspirin 81 MG tablet Take 81 mg by mouth daily.        Marland Kitchen levothyroxine (SYNTHROID, LEVOTHROID) 112 MCG tablet Take 100 mcg by mouth daily.       Marland Kitchen losartan (COZAAR) 50 MG tablet Take 100 mg by mouth daily.      . metoprolol succinate (TOPROL-XL) 25 MG 24 hr tablet Take 50 mg by mouth daily.      . Multiple Vitamin (MULTIVITAMIN) tablet Take 1 tablet by mouth daily.        . pravastatin (PRAVACHOL) 80 MG tablet Take 1 tablet (80 mg total) by mouth daily.  30 tablet  4   Current Facility-Administered Medications  Medication Dose Route Frequency Provider Last Rate Last Dose  . 0.9 %  sodium chloride infusion  500 mL Intravenous Continuous Sable Feil, MD        Past Medical History  Diagnosis Date  . CAD (coronary artery disease)     a. NSTEMI 5/12: Xience DES x 2 to RCA; b. cath 5/12: mRCA 99% (tx with PCI); dLM 30-40%, pLAD 30%, D1 occluded, CFX 20-30%, EF 40-45% with mod to severe inf HK and dist Ant AK  . Ischemic cardiomyopathy     a. echo 5/12 (after PCI): EF 50-55%, AS and apical HK, grade 1 diast dysfxn, mild MR  . DM2 (diabetes mellitus, type 2)   . HTN (hypertension)   . HLD (hyperlipidemia)   . Hypothyroidism   . Lung mass     LUL mass on chest CT 5/12; need repeat in 06/2011    Past Surgical History  Procedure Laterality Date  . Coronary stent placement     ROS:  As stated in the HPI and negative for all other systems.  PHYSICAL EXAM BP 120/60  Pulse 67  Ht 5\' 5"  (1.651 m)  Wt  130 lb 8 oz (59.194 kg)  BMI 21.72 kg/m2 GENERAL:  Well appearing NECK:  No jugular venous distention, waveform within normal limits, carotid upstroke brisk and symmetric, no bruits, no thyromegaly LUNGS:  Clear to auscultation bilaterally BACK:  No CVA tenderness HEART:  PMI not displaced or sustained,S1 and S2 within normal limits, no S3, no S4, no clicks, no rubs, no murmurs ABD:  Flat, positive bowel sounds normal in frequency in pitch, no bruits, no rebound, no guarding, no midline pulsatile mass, no hepatomegaly, no splenomegaly EXT:  2 plus pulses throughout, no edema, no cyanosis no clubbing  EKG:  Sinus bradycardia, rate 67, axis within normal limits, intervals within normal limits, no acute ST-T wave changes and PACs. 07/12/2012  ASSESSMENT AND PLAN  CAD (coronary artery disease) -  I will bring the patient back for a POET (Plain Old Exercise Test). This will allow me to screen for obstructive coronary disease, risk stratify and very importantly provide a prescription for exercise.  HLD (hyperlipidemia) -  He is having this followed by Foye Spurling, MD and he reports that he is at target  HTN (hypertension) -  The blood pressure is at target. No change in medications is indicated. We will continue with therapeutic lifestyle changes (TLC).

## 2014-07-28 NOTE — CV Procedure (Signed)
    Cardiac Catheterization Procedure Note  Name: Keith [name] MRN: [id number] DOB: Apr 10, 1941  Procedure: Left Heart Cath, Selective Coronary Angiography, LV angiography, PTCA and stenting of the first diagonal, FFR of the LAD  Indication: 73 yo male with prior stenting of the RCA in 2012 presents with a high risk stress test showing significant ST depression at one minute of exercise.   Procedural Details:  The right wrist was prepped, draped, and anesthetized with 1% lidocaine. Using the modified Seldinger technique, a 6 French slender sheath was introduced into the right radial artery. 3 mg of verapamil was administered through the sheath, weight-based unfractionated heparin was administered intravenously. Standard Judkins catheters were used for selective coronary angiography and left ventriculography. The left coronary artery was difficult to engage and was engaged with a 5 Fr. EBU guide. Catheter exchanges were performed over an exchange length guidewire.  PROCEDURAL FINDINGS Hemodynamics: AO 124/60 mean 85 mm Hg LV 117/15 mm Hg   Coronary angiography: Coronary dominance: right  Left mainstem: 30% distal left main stenosis.  Left anterior descending (LAD): There is an eccentric 40-50% ostial LAD stenosis. The proximal vessel has diffuse 20% disease. The first diagonal is a large branching vessel. It has a segmental 90% stenosis.   Left circumflex (LCx):  Mild wall irregularities less than 10%.   Right coronary artery (RCA): There is a mildly anterior takeoff. The RCA is widely patent at the prior stent sites in the proximal to mid vessel. There is 30% narrowing at the bifurcation of the PDA and PLOM.   Left ventriculography: Left ventricular systolic function is low normal, LVEF is estimated at 50%, there is no significant mitral regurgitation   PCI Note:  Following the diagnostic procedure, the decision was made to proceed with PCI.  Weight-based bivalirudin was given for  anticoagulation. Plavix 600 mg was given orally. Once a therapeutic ACT was achieved, the 5 [place] EBU guide catheter was used. We initially performed FFR assessment of the ostial LAD stenosis. With maximal hyperemia with IV Adenosine the FFR was 0.92 indicating that this lesion was not hemodynamically significant. We then proceeded with stenting of the diagonal.  A prowater coronary guidewire was used to cross the lesion.  The lesion was predilated with a 2.25 mm balloon.  The lesion was then stented with a 2.5 x 22 mm Orsiro Biotronic study stent.  The stent was postdilated with a 2.5 mm noncompliant balloon.  Following PCI, there was 0% residual stenosis and TIMI-3 flow. Final angiography confirmed an excellent result. The patient tolerated the procedure well. There were no immediate procedural complications. A TR band was used for radial hemostasis. The patient was transferred to the post catheterization recovery area for further monitoring.  PCI Data: Vessel - first diagonal/Segment - mid Percent Stenosis (pre)  90% TIMI-flow 3 Stent 2.5 x 22 Orsiro Biotonic study stent. Percent Stenosis (post) 0% TIMI-flow (post) 3  Final Conclusions:   1. Single vessel obstructive CAD- patent stents in the RCA 2. 40-50% ostial LAD with normal FFR of 0.92. 3. Successful stenting of the first diagonal with a DES. 4. Low normal LV function.   Recommendations:  DAPT for one year.   Keith [name], [place] 07/28/2014, 11:45 AM

## 2014-07-29 ENCOUNTER — Encounter (HOSPITAL_COMMUNITY): Payer: Self-pay | Admitting: Nurse Practitioner

## 2014-07-29 DIAGNOSIS — I10 Essential (primary) hypertension: Secondary | ICD-10-CM

## 2014-07-29 DIAGNOSIS — R9439 Abnormal result of other cardiovascular function study: Secondary | ICD-10-CM

## 2014-07-29 DIAGNOSIS — I2510 Atherosclerotic heart disease of native coronary artery without angina pectoris: Secondary | ICD-10-CM | POA: Diagnosis not present

## 2014-07-29 DIAGNOSIS — I25119 Atherosclerotic heart disease of native coronary artery with unspecified angina pectoris: Secondary | ICD-10-CM

## 2014-07-29 DIAGNOSIS — E119 Type 2 diabetes mellitus without complications: Secondary | ICD-10-CM

## 2014-07-29 DIAGNOSIS — E785 Hyperlipidemia, unspecified: Secondary | ICD-10-CM

## 2014-07-29 LAB — CBC
HEMATOCRIT: 40.6 % (ref 39.0–52.0)
Hemoglobin: 14.6 g/dL (ref 13.0–17.0)
MCH: 32.4 pg (ref 26.0–34.0)
MCHC: 36 g/dL (ref 30.0–36.0)
MCV: 90 fL (ref 78.0–100.0)
Platelets: 233 10*3/uL (ref 150–400)
RBC: 4.51 MIL/uL (ref 4.22–5.81)
RDW: 12.5 % (ref 11.5–15.5)
WBC: 7.9 10*3/uL (ref 4.0–10.5)

## 2014-07-29 LAB — BASIC METABOLIC PANEL
ANION GAP: 16 — AB (ref 5–15)
BUN: 12 mg/dL (ref 6–23)
CALCIUM: 8.7 mg/dL (ref 8.4–10.5)
CO2: 20 meq/L (ref 19–32)
Chloride: 101 mEq/L (ref 96–112)
Creatinine, Ser: 0.99 mg/dL (ref 0.50–1.35)
GFR calc Af Amer: >90 mL/min (ref >90)
GFR calc non Af Amer: 79 mL/min — ABNORMAL LOW (ref >90)
Glucose, Bld: 154 mg/dL — ABNORMAL HIGH (ref 70–99)
Potassium: 4.4 mEq/L (ref 3.7–5.3)
Sodium: 137 mEq/L (ref 137–147)

## 2014-07-29 LAB — GLUCOSE, CAPILLARY: GLUCOSE-CAPILLARY: 155 mg/dL — AB (ref 70–99)

## 2014-07-29 MED ORDER — NITROGLYCERIN 0.4 MG SL SUBL: 0.4000 mg | SUBLINGUAL_TABLET | SUBLINGUAL | Status: DC | PRN

## 2014-07-29 MED ORDER — CLOPIDOGREL BISULFATE 75 MG PO TABS: 75.0000 mg | ORAL_TABLET | Freq: Every day | ORAL | Status: DC

## 2014-07-29 MED FILL — Sodium Chloride IV Soln 0.9%: INTRAVENOUS | Qty: 50 | Status: AC

## 2014-07-29 NOTE — Discharge Summary (Signed)
Discharge Summary   Patient ID: Keith [name],  MRN: [id number], DOB/AGE: [birth year] 73 y.o.  Admit date: 07/28/2014 Discharge date: 07/29/2014  Primary Care Provider: Foye Spurling Primary Cardiologist: J. Hochrein, MD   Discharge Diagnoses Principal Problem:   CAD (coronary artery disease)   **S/P successful PCI and stenting of the first diagonal this admission.  Active Problems:   Abnormal stress test   DM2 (diabetes mellitus, type 2)   HTN (hypertension)   HLD (hyperlipidemia)   Hypothyroidism   Iron deficiency anemia  Allergies No Known Allergies  Procedures  Cardiac Catheterization and Percutaneous Coronary Intervention 10.5.2015  PROCEDURAL FINDINGS Hemodynamics: AO 124/60 mean 85 mm Hg LV 117/15 mm Hg              Coronary angiography: Coronary dominance: right  Left mainstem: 30% distal left main stenosis. Left anterior descending (LAD): There is an eccentric 40-50% ostial LAD stenosis. The proximal vessel has diffuse 20% disease. The first diagonal is a large branching vessel. It has a segmental 90% stenosis.     **Fractional flow reserve was performed within the ostial LAD stenosis and was found to be normal @ 0.92.  **The First Diagonal was successfully stented using a 2.5 x 22 Orsiro Biotonic study stent.  Left circumflex (LCx):  Mild wall irregularities less than 10%.   Right coronary artery (RCA): There is a mildly anterior takeoff. The RCA is widely patent at the prior stent sites in the proximal to mid vessel. There is 30% narrowing at the bifurcation of the PDA and PLOM.   Left ventriculography: Left ventricular systolic function is low normal, LVEF is estimated at 50%, there is no significant mitral regurgitation  _____________   History of Present Illness  73 year old male with a prior history of coronary artery disease status post non-ST elevation MI in May 2012 with drug-eluting stent placement to the right coronary artery. He was  recently seen in clinic without complaints. An exercise treadmill test was undertaken, and patient was noted to have significant ST segment depression in stage I. Decision was made to pursue diagnostic catheterization.  Hospital Course  Keith [name] presented to the [facility] cardiac catheterization laboratory on October 50,015 and underwent diagnostic cardiac catheterization. This revealed a 90% stenosis within a large first diagonal branch. The ostial LAD also had a 40-50% stenosis. Previously placed right coronary artery stent was widely patent. The first diagonal was felt to be the culprit vessel was successfully treated using a 2.5 x 22 Orsiro Biotonic study stent.  Fractional flow reserve was performed within the ostial LAD was found to be normal at 0.92. Post procedure, Keith [name] has been ambulating without recurrent symptoms or limitations. He will be discharged home today in good condition.  Discharge Vitals Blood pressure 138/48, pulse 68, temperature 98 F (36.7 C), temperature source Oral, resp. rate 18, height 5\' 5"  (1.651 m), weight 131 lb 2.8 oz (59.5 kg), SpO2 99.00%.  Filed Weights   07/28/14 0835 07/29/14 0005  Weight: 130 lb (58.968 kg) 131 lb 2.8 oz (59.5 kg)   Labs  CBC  Recent Labs  07/29/14 0343  WBC 7.9  HGB 14.6  HCT 40.6  MCV 90.0  PLT 578   Basic Metabolic Panel  Recent Labs  07/29/14 0343  NA 137  K 4.4  CL 101  CO2 20  GLUCOSE 154*  BUN 12  CREATININE 0.99  CALCIUM 8.7   Cardiac Enzymes  Recent Labs  07/28/14 0858  CKTOTAL 122  CKMB  1.7   Disposition  Pt is being discharged home today in good condition.  Follow-up Plans & Appointments      Follow-up Information   Follow up with Foye Spurling, MD. (as scheduled.)    Specialty:  Internal Medicine   Contact information:   [address] [place] [address] [phone]       Follow up with Tarri Fuller, PA-C On 08/12/2014. (8:30 AM (Dr. Rosezella Florida PA).)     Specialty:  Physician Assistant   Contact information:   [address] [place] [address] [phone]       Discharge Medications    Medication List         aspirin 81 MG tablet  Take 81 mg by mouth daily.     CENTRUM SILVER PO  Take 1 tablet by mouth daily.     clopidogrel 75 MG tablet  Commonly known as:  PLAVIX  Take 1 tablet (75 mg total) by mouth daily with breakfast.     glimepiride 1 MG tablet  Commonly known as:  AMARYL  Take 1 mg by mouth daily with breakfast.     levothyroxine 100 MCG tablet  Commonly known as:  SYNTHROID, LEVOTHROID  Take 100 mcg by mouth daily before breakfast.     losartan 100 MG tablet  Commonly known as:  COZAAR  Take 100 mg by mouth daily.     metoprolol succinate 50 MG 24 hr tablet  Commonly known as:  TOPROL-XL  Take 50 mg by mouth daily. Take with or immediately following a meal.     nitroGLYCERIN 0.4 MG SL tablet  Commonly known as:  NITROSTAT  Place 1 tablet (0.4 mg total) under the tongue every 5 (five) minutes as needed for chest pain.     pravastatin 40 MG tablet  Commonly known as:  PRAVACHOL  Take 80 mg by mouth every evening.       Outstanding Labs/Studies  None  Duration of Discharge Encounter   Greater than 30 minutes including physician time.  Signed, Murray Hodgkins NP 07/29/2014, 11:10 AM

## 2014-07-29 NOTE — Progress Notes (Signed)
CARDIAC REHAB PHASE I   PRE:  Rate/Rhythm: 88 SR  BP:  Supine: 145/65  Sitting:   Standing:    SaO2:   MODE:  Ambulation: 1000 ft   POST:  Rate/Rhythm: 98 SR  BP:  Supine:   Sitting: 119/70  Standing:    SaO2:  0910-1015 Pt tolerated ambulation well without c/o of cp or SOB. Gait steady. Pt to side of bed after walk with call light in reach. Completed stent discharge education with pt. He voices understanding. Pt agrees to [place]. CRP in [place], will send referral.  Rodney Langton RN 07/29/2014 10:09 AM

## 2014-07-29 NOTE — Discharge Instructions (Signed)

## 2014-07-29 NOTE — Progress Notes (Signed)
TELEMETRY: Reviewed telemetry pt in NSR: Filed Vitals:   07/28/14 1632 07/28/14 1940 07/29/14 0005 07/29/14 0541  BP: 166/92 176/66 145/59 129/56  Pulse: 65 63 82 77  Temp: 97.7 F (36.5 C) 98.3 F (36.8 C) 98.5 F (36.9 C) 98.5 F (36.9 C)  TempSrc: Oral Oral Oral Oral  Resp: 18 18 18 20   Height:      Weight:   131 lb 2.8 oz (59.5 kg)   SpO2: 99% 100% 95% 99%    Intake/Output Summary (Last 24 hours) at 07/29/14 0720 Last data filed at 07/28/14 2010  Gross per 24 hour  Intake   1450 ml  Output    500 ml  Net    950 ml   Filed Weights   07/28/14 0835 07/29/14 0005  Weight: 130 lb (58.968 kg) 131 lb 2.8 oz (59.5 kg)    Subjective Feels well. No chest pain or SOB.   Marland Kitchen aspirin  81 mg Oral Daily  . clopidogrel  75 mg Oral Q breakfast  . glimepiride  1 mg Oral Q breakfast  . insulin aspart  0-5 Units Subcutaneous QHS  . insulin aspart  0-9 Units Subcutaneous TID WC  . levothyroxine  100 mcg Oral QAC breakfast  . losartan  100 mg Oral Daily  . metoprolol succinate  50 mg Oral Daily  . pravastatin  80 mg Oral QPM      LABS: Basic Metabolic Panel:  Recent Labs  07/29/14 0343  NA 137  K 4.4  CL 101  CO2 20  GLUCOSE 154*  BUN 12  CREATININE 0.99  CALCIUM 8.7   Liver Function Tests: No results found for this basename: AST, ALT, ALKPHOS, BILITOT, PROT, ALBUMIN,  in the last 72 hours No results found for this basename: LIPASE, AMYLASE,  in the last 72 hours CBC:  Recent Labs  07/29/14 0343  WBC 7.9  HGB 14.6  HCT 40.6  MCV 90.0  PLT 233   Cardiac Enzymes:  Recent Labs  07/28/14 0858  CKTOTAL 122  CKMB 1.7   BNP: No results found for this basename: PROBNP,  in the last 72 hours D-Dimer: No results found for this basename: DDIMER,  in the last 72 hours Hemoglobin A1C: No results found for this basename: HGBA1C,  in the last 72 hours Fasting Lipid Panel: No results found for this basename: CHOL, HDL, LDLCALC, TRIG, CHOLHDL, LDLDIRECT,  in the  last 72 hours Thyroid Function Tests: No results found for this basename: TSH, T4TOTAL, FREET3, T3FREE, THYROIDAB,  in the last 72 hours   Radiology/Studies:  No results found.  Ecg: normal.  PHYSICAL EXAM General: Well developed, well nourished, in no acute distress. Head: Normocephalic, atraumatic, sclera non-icteric, oropharynx is clear Neck: Negative for carotid bruits. JVD not elevated. No adenopathy Lungs: Clear bilaterally to auscultation without wheezes, rales, or rhonchi. Breathing is unlabored. Heart: RRR S1 S2 without murmurs, rubs, or gallops.  Abdomen: Soft, non-tender, non-distended with normoactive bowel sounds.  No obvious abdominal masses. Msk:  Strength and tone appears normal for age. Extremities: No clubbing, cyanosis or edema.  Distal pedal pulses are 2+ and equal bilaterally. Radial site without hematoma. Neuro: Alert and oriented X 3. Moves all extremities spontaneously. Psych:  Responds to questions appropriately with a normal affect.  ASSESSMENT AND PLAN: 1. CAD with abnormal stress test. S/p DES of first diagonal. Normal FFR of LAD. Patent stents in RCA. Continue DAPT with ASA/Plavix for one year. Plan DC today.  2. DM  type 2. BS 119-175. Resume amaryl.  3. HTN continue losartan and metoprolol.   4. Hyperlipidemia. On pravastatin.  Present on Admission:  . Abnormal stress test  Signed, Artia Singley [place], [place] 07/29/2014 7:20 AM

## 2014-07-29 NOTE — Discharge Summary (Signed)
Patient seen and examined and history reviewed. Agree with above findings and plan. See earlier rounding note.  Keith [name], [place] 07/29/2014 1:41 PM

## 2014-08-12 ENCOUNTER — Encounter: Payer: Self-pay | Admitting: Physician Assistant

## 2014-08-12 ENCOUNTER — Ambulatory Visit (INDEPENDENT_AMBULATORY_CARE_PROVIDER_SITE_OTHER): Payer: Medicare Other | Admitting: Physician Assistant

## 2014-08-12 VITALS — BP 166/84 | HR 63 | Ht 65.0 in | Wt 131.2 lb

## 2014-08-12 DIAGNOSIS — I2583 Coronary atherosclerosis due to lipid rich plaque: Principal | ICD-10-CM

## 2014-08-12 DIAGNOSIS — I10 Essential (primary) hypertension: Secondary | ICD-10-CM

## 2014-08-12 DIAGNOSIS — I2510 Atherosclerotic heart disease of native coronary artery without angina pectoris: Secondary | ICD-10-CM

## 2014-08-12 DIAGNOSIS — E785 Hyperlipidemia, unspecified: Secondary | ICD-10-CM

## 2014-08-12 MED ORDER — AMLODIPINE BESYLATE 5 MG PO TABS: 5.0000 mg | ORAL_TABLET | Freq: Every day | ORAL | Status: DC

## 2014-08-12 NOTE — Assessment & Plan Note (Signed)
Patient reports no angina. He is taking Plavix as directed. He is also on aspirin, beta blocker and statin.

## 2014-08-12 NOTE — Assessment & Plan Note (Signed)
On a statin 

## 2014-08-12 NOTE — Progress Notes (Signed)
Date:  08/12/2014   ID:  Keith [name], DOB 03-23-1941, MRN [id number]  PCP:  Foye Spurling, MD  Primary Cardiologist: hochrein     History of Present Illness: Keith [name] is a 73 y.o. male with a prior history of coronary artery disease status post non-ST elevation MI in May 2012 with drug-eluting stent placement to the right coronary artery. He was recently seen in clinic without complaints. An exercise treadmill test was undertaken, and patient was noted to have significant ST segment depression in stage I. Decision was made to pursue diagnostic catheterization.    Keith [name] presented to the [facility] cardiac catheterization laboratory on October 50,015 and underwent diagnostic cardiac catheterization. This revealed a 90% stenosis within a large first diagonal branch. The ostial LAD also had a 40-50% stenosis. Previously placed right coronary artery stent was widely patent. The first diagonal was felt to be the culprit vessel was successfully treated using a 2.5 x 22 Orsiro Biotonic study stent. Fractional flow reserve was performed within the ostial LAD was found to be normal at 0.92.   Patient presents today for post hospital evaluation. He reports doing well with no complaints of angina. He is taking Plavix as directed.  He currently denies nausea, vomiting, fever, chest pain, shortness of breath, orthopnea, dizziness, PND, cough, congestion, abdominal pain, hematochezia, melena, lower extremity edema, claudication.  Wt Readings from Last 3 Encounters:  08/12/14 131 lb 3.2 oz (59.512 kg)  07/29/14 131 lb 2.8 oz (59.5 kg)  07/29/14 131 lb 2.8 oz (59.5 kg)     Past Medical History  Diagnosis Date  . CAD (coronary artery disease)     a. NSTEMI 5/12: Xience DES x 2 to RCA; b. cath 5/12: mRCA 99% (tx with PCI); c. 07/2014 Abnl Poet;  d. 07/2014 Cath/PCI: LM 30d, LAD 40-50ost (FFR 0.92), 20p, D1 90 (2.5x22 Orsiro Biotronic study stent), LCX 10, RCA patent stents, PDA 30, EF  50.  . Ischemic cardiomyopathy     a. echo 5/12 (after PCI): EF 50-55%, AS and apical HK, grade 1 diast dysfxn, mild MR  . DM2 (diabetes mellitus, type 2)   . HTN (hypertension)   . HLD (hyperlipidemia)   . Hypothyroidism   . Lung mass     LUL mass on chest CT 5/12; need repeat in 06/2011    Current Outpatient Prescriptions  Medication Sig Dispense Refill  . aspirin 81 MG tablet Take 81 mg by mouth daily.        . clopidogrel (PLAVIX) 75 MG tablet Take 1 tablet (75 mg total) by mouth daily with breakfast.  30 tablet  6  . glimepiride (AMARYL) 1 MG tablet Take 1 mg by mouth daily with breakfast.      . levothyroxine (SYNTHROID, LEVOTHROID) 100 MCG tablet Take 100 mcg by mouth daily before breakfast.      . losartan (COZAAR) 100 MG tablet Take 100 mg by mouth daily.      . metoprolol succinate (TOPROL-XL) 50 MG 24 hr tablet Take 50 mg by mouth daily. Take with or immediately following a meal.      . Multiple Vitamins-Minerals (CENTRUM SILVER PO) Take 1 tablet by mouth daily.      . nitroGLYCERIN (NITROSTAT) 0.4 MG SL tablet Place 1 tablet (0.4 mg total) under the tongue every 5 (five) minutes as needed for chest pain.  25 tablet  3  . pravastatin (PRAVACHOL) 40 MG tablet Take 80 mg by mouth every evening.      Marland Kitchen  amLODipine (NORVASC) 5 MG tablet Take 1 tablet (5 mg total) by mouth daily.  30 tablet  6   No current facility-administered medications for this visit.    Allergies:   No Known Allergies  Social History:  The patient  reports that he quit smoking about 18 years ago. He has never used smokeless tobacco. He reports that he does not drink alcohol or use illicit drugs.   Family history:   Family History  Problem Relation Age of Onset  . Diabetes Father     mother  . Heart disease Father     mother, brother    ROS:  Please see the history of present illness.  All other systems reviewed and negative.   PHYSICAL EXAM: VS:  BP 166/84  Pulse 63  Ht 5\' 5"  (1.651 m)  Wt 131  lb 3.2 oz (59.512 kg)  BMI 21.83 kg/m2 Well nourished, well developed, in no acute distress HEENT: Pupils are equal round react to light accommodation extraocular movements are intact.  Neck: no JVDNo cervical lymphadenopathy. Cardiac: Regular rate and rhythm without murmurs rubs or gallops. Lungs:  clear to auscultation bilaterally, no wheezing, rhonchi or rales Ext: no lower extremity edema.  2+ radial and dorsalis pedis pulses.  Skin: warm and dry Neuro:  Grossly normal  EKG: Normal sinus rhythm rate 63 beats per minute  ASSESSMENT AND PLAN:  Problem List Items Addressed This Visit   CAD (coronary artery disease) - Primary     Patient reports no angina. He is taking Plavix as directed. He is also on aspirin, beta blocker and statin.    Relevant Medications      amLODIpine (NORVASC) tablet   Other Relevant Orders      EKG 12-Lead   HLD (hyperlipidemia)     On a statin    Relevant Medications      amLODIpine (NORVASC) tablet   HTN (hypertension)     Blood pressure is not well controlled. He says it averages 846 systolic. He is on metoprolol 50 and losartan 100.  I will add amlodipine 5 mg.    Relevant Medications      amLODIpine (NORVASC) tablet

## 2014-08-12 NOTE — Assessment & Plan Note (Signed)
Blood pressure is not well controlled. He says it averages 774 systolic. He is on metoprolol 50 and losartan 100.  I will add amlodipine 5 mg.

## 2014-08-12 NOTE — Patient Instructions (Signed)
Your physician has recommended you make the following change in your medication:start new prescription for amlodipine. This has already been sent to the pharmacy.  Your physician recommends that you schedule a follow-up appointment in: 3 months with Dr. Percival Spanish.

## 2014-08-14 ENCOUNTER — Telehealth: Payer: Self-pay | Admitting: Cardiology

## 2014-08-14 NOTE — Telephone Encounter (Signed)
Returned call to [place] with Cardiac Rehab at [facility].Stated she will refax cardiac rehab order for Dr.Jordan to sign.Advised Dr.Jordan will be back in office 08/18/14 and I will have him sign and fax back to her.

## 2014-08-14 NOTE — Telephone Encounter (Signed)
Helene Kelp was calling to speak with Dr. Doug Sou about some paper work that was faxed to our office . Please call  Thanks

## 2014-10-02 ENCOUNTER — Encounter (HOSPITAL_COMMUNITY): Payer: Self-pay | Admitting: Cardiology

## 2014-11-03 ENCOUNTER — Ambulatory Visit (INDEPENDENT_AMBULATORY_CARE_PROVIDER_SITE_OTHER): Payer: Medicare Other | Admitting: Cardiology

## 2014-11-03 ENCOUNTER — Encounter: Payer: Self-pay | Admitting: Cardiology

## 2014-11-03 VITALS — BP 152/80 | HR 70 | Ht 65.0 in | Wt 131.2 lb

## 2014-11-03 DIAGNOSIS — I10 Essential (primary) hypertension: Secondary | ICD-10-CM

## 2014-11-03 DIAGNOSIS — I2510 Atherosclerotic heart disease of native coronary artery without angina pectoris: Secondary | ICD-10-CM

## 2014-11-03 NOTE — Progress Notes (Signed)
HPI The patient presents for evaluation of CAD.  Since I last saw him he an abnormal POET (Plain Old Exercise Treadmill) without symptoms.  He had PCI of a diagonal vessel.  He continues to walk.  The patient denies any new symptoms such as chest discomfort, neck or arm discomfort. There has been no new shortness of breath, PND or orthopnea. There have been no reported palpitations, presyncope or syncope.  No Known Allergies  Current Outpatient Prescriptions  Medication Sig Dispense Refill  . amLODipine (NORVASC) 5 MG tablet Take 1 tablet (5 mg total) by mouth daily. 30 tablet 6  . aspirin 81 MG tablet Take 81 mg by mouth daily.      . clopidogrel (PLAVIX) 75 MG tablet Take 1 tablet (75 mg total) by mouth daily with breakfast. 30 tablet 6  . glimepiride (AMARYL) 1 MG tablet Take 1 mg by mouth daily with breakfast.    . levothyroxine (SYNTHROID, LEVOTHROID) 100 MCG tablet Take 100 mcg by mouth daily before breakfast.    . losartan (COZAAR) 100 MG tablet Take 100 mg by mouth daily.    . metoprolol succinate (TOPROL-XL) 50 MG 24 hr tablet Take 50 mg by mouth daily. Take with or immediately following a meal.    . Multiple Vitamins-Minerals (CENTRUM SILVER PO) Take 1 tablet by mouth daily.    . nitroGLYCERIN (NITROSTAT) 0.4 MG SL tablet Place 1 tablet (0.4 mg total) under the tongue every 5 (five) minutes as needed for chest pain. 25 tablet 3  . pravastatin (PRAVACHOL) 40 MG tablet Take 80 mg by mouth every evening.     No current facility-administered medications for this visit.    Past Medical History  Diagnosis Date  . CAD (coronary artery disease)     a. NSTEMI 5/12: Xience DES x 2 to RCA; b. cath 5/12: mRCA 99% (tx with PCI); c. 07/2014 Abnl Poet;  d. 07/2014 Cath/PCI: LM 30d, LAD 40-50ost (FFR 0.92), 20p, D1 90 (2.5x22 Orsiro Biotronic study stent), LCX 10, RCA patent stents, PDA 30, EF 50.  . Ischemic cardiomyopathy   . DM2 (diabetes mellitus, type 2)   . HTN (hypertension)   .  HLD (hyperlipidemia)   . Hypothyroidism   . Lung mass     LUL mass on chest CT 5/12; need repeat in 06/2011    Past Surgical History  Procedure Laterality Date  . Coronary stent placement    . Cardiac catheterization  07/28/2014  . Coronary stent placement      1st diagonal      dr [place]  . Left heart catheterization with coronary angiogram N/A 07/28/2014    Procedure: LEFT HEART CATHETERIZATION WITH CORONARY ANGIOGRAM;  Surgeon: Peter M [place], MD;  Location[facility] C[facility] LAB;  Service: Cardiovascular;  Laterality: N/A;   ROS:  As stated in the HPI and negative for all other systems.  PHYSICAL EXAM BP 152/80 mmHg  Pulse 70  Ht 5\' 5"  (1.651 m)  Wt 131 lb 3.2 oz (59.512 kg)  BMI 21.83 kg/m2 GENERAL:  Well appearing NECK:  No jugular venous distention, waveform within normal limits, carotid upstroke brisk and symmetric, no bruits, no thyromegaly LUNGS:  Clear to auscultation bilaterally BACK:  No CVA tenderness HEART:  PMI not displaced or sustained,S1 and S2 within normal limits, no S3, no S4, no clicks, no rubs, no murmurs ABD:  Flat, positive bowel sounds normal in frequency in pitch, no bruits, no rebound, no guarding, no midline pulsatile mass, no hepatomegaly,  no splenomegaly EXT:  2 plus pulses throughout, no edema, no cyanosis no clubbing   ASSESSMENT AND PLAN  CAD (coronary artery disease) -  The patient has no new sypmtoms.  No further cardiovascular testing is indicated.  We will continue with aggressive risk reduction and meds as listed.  HLD (hyperlipidemia) -  He is having this followed by Foye Spurling, MD   HTN (hypertension) -  The blood pressure is mildly elevated but he reports that it is at target at home.Marland Kitchen No change in medications is indicated. We will continue with therapeutic lifestyle changes (TLC).

## 2014-11-03 NOTE — Patient Instructions (Signed)
Your physician wants you to follow-up in: 1 Year. You will receive a reminder letter in the mail two months in advance. If you don't receive a letter, please call our office to schedule the follow-up appointment.  

## 2015-01-09 ENCOUNTER — Other Ambulatory Visit: Payer: Self-pay

## 2015-01-09 MED ORDER — CLOPIDOGREL BISULFATE 75 MG PO TABS: 75.0000 mg | ORAL_TABLET | Freq: Every day | ORAL | Status: DC

## 2015-01-28 ENCOUNTER — Other Ambulatory Visit: Payer: Self-pay | Admitting: Physician Assistant

## 2015-01-28 NOTE — Telephone Encounter (Signed)
Rx(s) sent to pharmacy electronically.  

## 2015-07-03 ENCOUNTER — Encounter: Payer: Self-pay | Admitting: Cardiology

## 2015-07-03 ENCOUNTER — Ambulatory Visit (INDEPENDENT_AMBULATORY_CARE_PROVIDER_SITE_OTHER): Payer: Medicare Other | Admitting: Cardiology

## 2015-07-03 VITALS — BP 162/72 | HR 67 | Ht 66.0 in | Wt 133.0 lb

## 2015-07-03 DIAGNOSIS — I10 Essential (primary) hypertension: Secondary | ICD-10-CM | POA: Diagnosis not present

## 2015-07-03 DIAGNOSIS — I2510 Atherosclerotic heart disease of native coronary artery without angina pectoris: Secondary | ICD-10-CM | POA: Diagnosis not present

## 2015-07-03 NOTE — Progress Notes (Signed)
HPI The patient presents for evaluation of CAD.  In the fall of last year he an abnormal POET (Plain Old Exercise Treadmill) without symptoms.  He had PCI of a diagonal vessel.  He continues to walk.  The patient denies any new symptoms such as chest discomfort, neck or arm discomfort. There has been no new shortness of breath, PND or orthopnea. There have been no reported palpitations, presyncope or syncope.   He is walking on a treadmill routinely.  No Known Allergies  Current Outpatient Prescriptions  Medication Sig Dispense Refill  . amLODipine (NORVASC) 5 MG tablet Take 1 tablet (5 mg total) by mouth daily. 30 tablet 9  . aspirin 81 MG tablet Take 81 mg by mouth daily.      . clopidogrel (PLAVIX) 75 MG tablet Take 1 tablet (75 mg total) by mouth daily with breakfast. 30 tablet 11  . glimepiride (AMARYL) 1 MG tablet Take 2 mg by mouth daily with breakfast.     . levothyroxine (SYNTHROID, LEVOTHROID) 100 MCG tablet Take 100 mcg by mouth daily before breakfast.    . losartan (COZAAR) 100 MG tablet Take 100 mg by mouth daily.    . metoprolol succinate (TOPROL-XL) 50 MG 24 hr tablet Take 50 mg by mouth daily. Take with or immediately following a meal.    . Multiple Vitamins-Minerals (CENTRUM SILVER PO) Take 1 tablet by mouth daily.    . nitroGLYCERIN (NITROSTAT) 0.4 MG SL tablet Place 1 tablet (0.4 mg total) under the tongue every 5 (five) minutes as needed for chest pain. 25 tablet 3  . pravastatin (PRAVACHOL) 40 MG tablet Take 80 mg by mouth every evening.     No current facility-administered medications for this visit.    Past Medical History  Diagnosis Date  . CAD (coronary artery disease)     a. NSTEMI 5/12: Xience DES x 2 to RCA; b. cath 5/12: mRCA 99% (tx with PCI); c. 07/2014 Abnl Poet;  d. 07/2014 Cath/PCI: LM 30d, LAD 40-50ost (FFR 0.92), 20p, D1 90 (2.5x22 Orsiro Biotronic study stent), LCX 10, RCA patent stents, PDA 30, EF 50.  . Ischemic cardiomyopathy   . DM2 (diabetes  mellitus, type 2)   . HTN (hypertension)   . HLD (hyperlipidemia)   . Hypothyroidism   . Lung mass     LUL mass on chest CT 5/12; need repeat in 06/2011    Past Surgical History  Procedure Laterality Date  . Coronary stent placement    . Cardiac catheterization  07/28/2014  . Coronary stent placement      1st diagonal      dr [place]  . Left heart catheterization with coronary angiogram N/A 07/28/2014    Procedure: LEFT HEART CATHETERIZATION WITH CORONARY ANGIOGRAM;  Surgeon: Peter M Martin[place], MD;  Location: [facility] CATH LAB;  Service: Cardiovascular;  Laterality: N/A;   ROS:  As stated in the HPI and negative for all other systems.  PHYSICAL EXAM BP 162/72 mmHg  Pulse 67  Ht 5\' 6"  (1.676 m)  Wt 133 lb (60.328 kg)  BMI 21.48 kg/m2 GENERAL:  Well appearing NECK:  No jugular venous distention, waveform within normal limits, carotid upstroke brisk and symmetric, no bruits, no thyromegaly LUNGS:  Clear to auscultation bilaterally BACK:  No CVA tenderness HEART:  PMI not displaced or sustained,S1 and S2 within normal limits, no S3, no S4, no clicks, no rubs, no murmurs ABD:  Flat, positive bowel sounds normal in frequency in pitch, no bruits, no  rebound, no guarding, no midline pulsatile mass, no hepatomegaly, no splenomegaly EXT:  2 plus pulses throughout, no edema, no cyanosis no clubbing  EKG:   Sinus rhythm, rate 67, axis within normal limits, intervals within normal limits, poor anterior R wave progression, no acute ST-T wave changes. 07/03/2015  ASSESSMENT AND PLAN  CAD (coronary artery disease) -  The patient has no new sypmtoms.  No further cardiovascular testing is indicated.  We will continue with aggressive risk reduction and meds as listed.   In October at the one-year anniversary of his stenting he can stop Plavix.  HLD (hyperlipidemia) -  He is having this followed by Foye Spurling, MD   HTN (hypertension) -  The blood pressure is mildly elevated but he reports that it is  at target at home.   I have instructed the patient to record a blood pressure diary and recording this. This will be presented for my review and pending these results I will make further suggestions about changes in therapy for optimal blood pressure control.

## 2015-07-03 NOTE — Patient Instructions (Signed)
Your physician wants you to follow-up in: 6 Months. You will receive a reminder letter in the mail two months in advance. If you don't receive a letter, please call our office to schedule the follow-up appointment.  Your physician has recommended you make the following change in your medication: STOP Plavix in OCTOBER

## 2015-11-16 ENCOUNTER — Telehealth: Payer: Self-pay | Admitting: *Deleted

## 2015-11-16 NOTE — Telephone Encounter (Signed)
bp readings reviewed by dr hochrein, No change in medications at this time.  Left message for pt

## 2016-04-07 ENCOUNTER — Encounter: Payer: Self-pay | Admitting: Gastroenterology

## 2016-05-03 ENCOUNTER — Encounter: Payer: Self-pay | Admitting: Cardiology

## 2016-05-18 ENCOUNTER — Encounter: Payer: Self-pay | Admitting: Cardiology

## 2016-05-18 NOTE — Progress Notes (Signed)
HPI The patient presents for evaluation of CAD.  In the fall of 2015 he had an abnormal POET (Plain Old Exercise Treadmill) without symptoms.  He had PCI of a diagonal vessel.  He returns for follow up.  He has done well since I last saw him. He remains active doing yoga and some exercising though is not walking on the treadmill anymore. He denies any new cardiovascular symptoms. He's had no chest pressure, neck or arm discomfort. He's had no palpitations, presyncope or syncope. He's had no shortness of breath, PND or orthopnea. He slowly gained a few pounds over the years.  No Known Allergies  Current Outpatient Prescriptions  Medication Sig Dispense Refill  . amLODipine (NORVASC) 5 MG tablet Take 1 tablet (5 mg total) by mouth daily. 30 tablet 9  . aspirin 81 MG tablet Take 81 mg by mouth daily.      Marland Kitchen glimepiride (AMARYL) 2 MG tablet Take 2 mg by mouth daily with breakfast.    . levothyroxine (SYNTHROID, LEVOTHROID) 88 MCG tablet Take 88 mcg by mouth daily before breakfast.    . losartan (COZAAR) 100 MG tablet Take 100 mg by mouth daily.    . metoprolol succinate (TOPROL-XL) 50 MG 24 hr tablet Take 50 mg by mouth daily. Take with or immediately following a meal.    . Multiple Vitamins-Minerals (CENTRUM SILVER PO) Take 1 tablet by mouth daily.    . nitroGLYCERIN (NITROSTAT) 0.4 MG SL tablet Place 1 tablet (0.4 mg total) under the tongue every 5 (five) minutes as needed for chest pain. 25 tablet 3  . pravastatin (PRAVACHOL) 80 MG tablet Take 80 mg by mouth daily.     No current facility-administered medications for this visit.     Past Medical History:  Diagnosis Date  . CAD (coronary artery disease)    a. NSTEMI 5/12: Xience DES x 2 to RCA; b. cath 5/12: mRCA 99% (tx with PCI); c. 07/2014 Abnl Poet;  d. 07/2014 Cath/PCI: LM 30d, LAD 40-50ost (FFR 0.92), 20p, D1 90 (2.5x22 Orsiro Biotronic study stent), LCX 10, RCA patent stents, PDA 30, EF 50.  Marland Kitchen DM2 (diabetes mellitus, type 2) ([place])    . HLD (hyperlipidemia)   . HTN (hypertension)   . Hypothyroidism   . Lung mass    LUL mass on chest CT 5/12; need repeat in 06/2011    Past Surgical History:  Procedure Laterality Date  . CARDIAC CATHETERIZATION  07/28/2014  . CORONARY STENT PLACEMENT    . CORONARY STENT PLACEMENT     1st diagonal      dr [place]  . LEFT HEART CATHETERIZATION WITH CORONARY ANGIOGRAM N/A 07/28/2014   Procedure: LEFT HEART CATHETERIZATION WITH CORONARY ANGIOGRAM;  Surgeon: Peter M Martinique, MD;  Location: [facility] CATH LAB;  Service: Cardiovascular;  Laterality: N/A;   ROS:  As stated in the HPI and negative for all other systems.  PHYSICAL EXAM BP 124/62   Pulse 62   Ht 5\' 5"  (1.651 m)   Wt 135 lb (61.2 kg)   BMI 22.47 kg/m  GENERAL:  Well appearing NECK:  No jugular venous distention, waveform within normal limits, carotid upstroke brisk and symmetric, no bruits, no thyromegaly LUNGS:  Clear to auscultation bilaterally BACK:  No CVA tenderness HEART:  PMI not displaced or sustained,S1 and S2 within normal limits, no S3, no S4, no clicks, no rubs, no murmurs ABD:  Flat, positive bowel sounds normal in frequency in pitch, no bruits, no rebound, no guarding,  no midline pulsatile mass, no hepatomegaly, no splenomegaly EXT:  2 plus pulses throughout, no edema, no cyanosis no clubbing  EKG:   Sinus rhythm, rate 62, axis within normal limits, intervals within normal limits, poor anterior R wave progression, no acute ST-T wave changes. 05/19/2016  ASSESSMENT AND PLAN  CAD (coronary artery disease) -  The patient has no new sypmtoms.  No further cardiovascular testing is indicated.  We will continue with aggressive risk reduction and meds as listed.   He did stop his Plavix as suggested last year.  HLD (hyperlipidemia) -  He is having this followed by Foye Spurling, MD   HTN (hypertension) -  The blood pressure is at target. No change in medications is indicated. We will continue with therapeutic lifestyle  changes (TLC).

## 2016-05-19 ENCOUNTER — Encounter: Payer: Self-pay | Admitting: Cardiology

## 2016-05-19 ENCOUNTER — Ambulatory Visit (INDEPENDENT_AMBULATORY_CARE_PROVIDER_SITE_OTHER): Payer: Medicare Other | Admitting: Cardiology

## 2016-05-19 VITALS — BP 124/62 | HR 62 | Ht 65.0 in | Wt 135.0 lb

## 2016-05-19 DIAGNOSIS — I2510 Atherosclerotic heart disease of native coronary artery without angina pectoris: Secondary | ICD-10-CM | POA: Diagnosis not present

## 2016-05-19 NOTE — Patient Instructions (Signed)
Your physician wants you to follow-up in: 1 Year. You will receive a reminder letter in the mail two months in advance. If you don't receive a letter, please call our office to schedule the follow-up appointment.  

## 2016-05-26 NOTE — Addendum Note (Signed)
Addended by: Cristopher Estimable on: 05/26/2016 11:36 AM   Modules accepted: Orders

## 2017-05-12 ENCOUNTER — Encounter: Payer: Self-pay | Admitting: *Deleted

## 2017-05-28 ENCOUNTER — Encounter: Payer: Self-pay | Admitting: Cardiology

## 2017-05-28 NOTE — Progress Notes (Signed)
However    HPI The patient presents for evaluation of CAD.  In the fall of 2015 he had an abnormal POET (Plain Old Exercise Treadmill) without symptoms.  He had PCI of a diagonal vessel.  He returns for follow up.  He does have some chest pain if he presses on his sternum.  He otherwise does OK.  The patient denies any new symptoms such as neck or arm discomfort. There has been no new shortness of breath, PND or orthopnea. There have been no reported palpitations, presyncope or syncope.   No Known Allergies  Current Outpatient Prescriptions  Medication Sig Dispense Refill  . amLODipine (NORVASC) 5 MG tablet Take 1 tablet (5 mg total) by mouth daily. 30 tablet 9  . aspirin 81 MG tablet Take 81 mg by mouth daily.      Marland Kitchen glimepiride (AMARYL) 2 MG tablet Take 2 mg by mouth daily with breakfast.    . levothyroxine (SYNTHROID, LEVOTHROID) 88 MCG tablet Take 88 mcg by mouth daily before breakfast.    . losartan (COZAAR) 100 MG tablet Take 100 mg by mouth daily.    . metoprolol succinate (TOPROL-XL) 50 MG 24 hr tablet Take 50 mg by mouth daily. Take with or immediately following a meal.    . Multiple Vitamins-Minerals (CENTRUM SILVER PO) Take 1 tablet by mouth daily.    . nitroGLYCERIN (NITROSTAT) 0.4 MG SL tablet Place 1 tablet (0.4 mg total) under the tongue every 5 (five) minutes as needed for chest pain. 25 tablet 3  . pravastatin (PRAVACHOL) 80 MG tablet Take 80 mg by mouth daily.     No current facility-administered medications for this visit.     Past Medical History:  Diagnosis Date  . CAD (coronary artery disease)    a. NSTEMI 5/12: Xience DES x 2 to RCA; b. cath 5/12: mRCA 99% (tx with PCI); c. 07/2014 Abnl Poet;  d. 07/2014 Cath/PCI: LM 30d, LAD 40-50ost (FFR 0.92), 20p, D1 90 (2.5x22 Orsiro Biotronic study stent), LCX 10, RCA patent stents, PDA 30, EF 50.  Marland Kitchen DM2 (diabetes mellitus, type 2) ([place])   . HLD (hyperlipidemia)   . HTN (hypertension)   . Hypothyroidism   . Lung mass    LUL mass on chest CT 5/12:  stable on CT 2013    Past Surgical History:  Procedure Laterality Date  . LEFT HEART CATHETERIZATION WITH CORONARY ANGIOGRAM N/A 07/28/2014   Procedure: LEFT HEART CATHETERIZATION WITH CORONARY ANGIOGRAM;  Surgeon: Peter M Martinique, MD;  Location: [facility] CATH LAB;  Service: Cardiovascular;  Laterality: N/A;   ROS:  As stated in the HPI and negative for all other systems.    PHYSICAL EXAM BP 138/72   Pulse (!) 59   Ht 5\' 5"  (1.651 m)   Wt 134 lb (60.8 kg)   BMI 22.30 kg/m   GENERAL:  Well appearing NECK:  No jugular venous distention, waveform within normal limits, carotid upstroke brisk and symmetric, no bruits, no thyromegaly LUNGS:  Clear to auscultation bilaterally BACK:  No CVA tenderness CHEST:  Unremarkable HEART:  PMI not displaced or sustained,S1 and S2 within normal limits, no S3, no S4, no clicks, no rubs, no murmurs ABD:  Flat, positive bowel sounds normal in frequency in pitch, no bruits, no rebound, no guarding, no midline pulsatile mass, no hepatomegaly, no splenomegaly EXT:  2 plus pulses throughout, no edema, no cyanosis no clubbing   EKG:   Sinus rhythm, rate 59, axis within normal limits, intervals within normal  limits, poor anterior R wave progression, no acute ST-T wave changes. 05/29/2017  ASSESSMENT AND PLAN  CAD (coronary artery disease) -  The patient does have some atypical chest pain.  I will bring the patient back for a POET (Plain Old Exercise Test). This will allow me to screen for obstructive coronary disease, risk stratify and very importantly provide a prescription for exercise.  HLD (hyperlipidemia) -  He says that he is at target.   He is having this followed by Foye Spurling, MD   HTN (hypertension) -  The blood pressure is at target. No change in medications is indicated. We will continue with therapeutic lifestyle changes (TLC).  DM -  A1C is 7.  We discussed diet and exercise.  He is not exercising and he eats snack  food that is not good for him.  He came with his son and we addressed this.

## 2017-05-29 ENCOUNTER — Ambulatory Visit (INDEPENDENT_AMBULATORY_CARE_PROVIDER_SITE_OTHER): Payer: Medicare Other | Admitting: Cardiology

## 2017-05-29 ENCOUNTER — Encounter: Payer: Self-pay | Admitting: Cardiology

## 2017-05-29 VITALS — BP 138/72 | HR 59 | Ht 65.0 in | Wt 134.0 lb

## 2017-05-29 DIAGNOSIS — I10 Essential (primary) hypertension: Secondary | ICD-10-CM | POA: Diagnosis not present

## 2017-05-29 DIAGNOSIS — I2510 Atherosclerotic heart disease of native coronary artery without angina pectoris: Secondary | ICD-10-CM | POA: Diagnosis not present

## 2017-05-29 DIAGNOSIS — E785 Hyperlipidemia, unspecified: Secondary | ICD-10-CM | POA: Diagnosis not present

## 2017-05-29 NOTE — Patient Instructions (Signed)
Medication Instructions:  Continue current medications  If you need a refill on your cardiac medications before your next appointment, please call your pharmacy.  Labwork: None Ordered   Testing/Procedures: Your physician has requested that you have an exercise tolerance test. For further information please visit [email or web address]. Please also follow instruction sheet, as given.   Follow-Up: Your physician wants you to follow-up in: 1 Year. You should receive a reminder letter in the mail two months in advance. If you do not receive a letter, please call our office [phone].       Thank you for choosing [facility]!!       

## 2017-06-01 ENCOUNTER — Other Ambulatory Visit: Payer: Self-pay

## 2017-06-01 MED ORDER — NITROGLYCERIN 0.4 MG SL SUBL: 0.4000 mg | SUBLINGUAL_TABLET | SUBLINGUAL | 3 refills | Status: DC | PRN

## 2017-06-02 ENCOUNTER — Telehealth (HOSPITAL_COMMUNITY): Payer: Self-pay

## 2017-06-02 NOTE — Telephone Encounter (Signed)
Encounter complete. 

## 2017-06-07 ENCOUNTER — Ambulatory Visit (HOSPITAL_COMMUNITY)
Admission: RE | Admit: 2017-06-07 | Discharge: 2017-06-07 | Disposition: A | Discharge status: Home / Self Care | Payer: Medicare Other | Source: Ambulatory Visit | Attending: Cardiovascular Disease | Admitting: Cardiovascular Disease

## 2017-06-07 ENCOUNTER — Encounter (HOSPITAL_COMMUNITY): Payer: Self-pay | Admitting: *Deleted

## 2017-06-07 DIAGNOSIS — I2510 Atherosclerotic heart disease of native coronary artery without angina pectoris: Secondary | ICD-10-CM | POA: Diagnosis not present

## 2017-06-07 LAB — EXERCISE TOLERANCE TEST
CSEPHR: 88 %
Estimated workload: 10.1 METS
Exercise duration (min): 8 min
Exercise duration (sec): 20 s
MPHR: 143 {beats}/min
Peak HR: 126 {beats}/min
RPE: 18
Rest HR: 60 {beats}/min

## 2017-06-07 NOTE — Progress Notes (Unsigned)
Abnormal ETT was reviewed by Dr. Oval Linsey. Patient was given the ok to be discharged.

## 2017-11-27 LAB — HM DIABETES EYE EXAM

## 2018-06-03 NOTE — Progress Notes (Signed)
HPI The patient presents for evaluation of CAD.  In the fall of 2015 he had an abnormal POET (Plain Old Exercise Treadmill) without symptoms.  He had PCI of a diagonal vessel. He had a POET (Plain Old Exercise Treadmill) last year and this was negative for ischemia.  He returns for follow up.  Since I last saw him he has done well.  The patient denies any new symptoms such as chest discomfort, neck or arm discomfort. There has been no new shortness of breath, PND or orthopnea. There have been no reported palpitations, presyncope or syncope.   He walks everyday.     No Known Allergies  Current Outpatient Medications  Medication Sig Dispense Refill  . amLODipine (NORVASC) 5 MG tablet Take 1 tablet (5 mg total) by mouth daily. 30 tablet 9  . aspirin 81 MG tablet Take 81 mg by mouth daily.      Marland Kitchen glimepiride (AMARYL) 2 MG tablet Take 2 mg by mouth daily with breakfast.    . losartan (COZAAR) 100 MG tablet Take 100 mg by mouth daily.    . metoprolol succinate (TOPROL-XL) 50 MG 24 hr tablet Take 50 mg by mouth daily. Take with or immediately following a meal.    . Multiple Vitamins-Minerals (CENTRUM SILVER PO) Take 1 tablet by mouth daily.    . nitroGLYCERIN (NITROSTAT) 0.4 MG SL tablet Place 1 tablet (0.4 mg total) under the tongue every 5 (five) minutes as needed for chest pain. 25 tablet 3  . pravastatin (PRAVACHOL) 80 MG tablet Take 80 mg by mouth daily.    Marland Kitchen levothyroxine (SYNTHROID, LEVOTHROID) 50 MCG tablet TAKE 1 TABLET BY MOUTH ON AN EMPTY STOMACH IN THE MORNING  3   No current facility-administered medications for this visit.     Past Medical History:  Diagnosis Date  . CAD (coronary artery disease)    a. NSTEMI 5/12: Xience DES x 2 to RCA; b. cath 5/12: mRCA 99% (tx with PCI); c. 07/2014 Abnl Poet;  d. 07/2014 Cath/PCI: LM 30d, LAD 40-50ost (FFR 0.92), 20p, D1 90 (2.5x22 Orsiro Biotronic study stent), LCX 10, RCA patent stents, PDA 30, EF 50.  Marland Kitchen DM2 (diabetes mellitus, type 2)  ([place])   . HLD (hyperlipidemia)   . HTN (hypertension)   . Hypothyroidism   . Lung mass    LUL mass on chest CT 5/12:  stable on CT 2013    Past Surgical History:  Procedure Laterality Date  . LEFT HEART CATHETERIZATION WITH CORONARY ANGIOGRAM N/A 07/28/2014   Procedure: LEFT HEART CATHETERIZATION WITH CORONARY ANGIOGRAM;  Surgeon: Peter M Martinique, MD;  Location: [facility] CATH LAB;  Service: Cardiovascular;  Laterality: N/A;    ROS:  As stated in the HPI and negative for all other systems.  PHYSICAL EXAM BP (!) 148/80 (BP Location: Left Arm, Patient Position: Sitting)   Pulse 66   Ht 5\' 5"  (1.651 m)   Wt 131 lb 9.6 oz (59.7 kg)   BMI 21.90 kg/m   GENERAL:  Well appearing NECK:  No jugular venous distention, waveform within normal limits, carotid upstroke brisk and symmetric, no bruits, no thyromegaly LUNGS:  Clear to auscultation bilaterally CHEST:  Unremarkable HEART:  PMI not displaced or sustained,S1 and S2 within normal limits, no S3, no S4, no clicks, no rubs, no murmurs ABD:  Flat, positive bowel sounds normal in frequency in pitch, no bruits, no rebound, no guarding, no midline pulsatile mass, no hepatomegaly, no splenomegaly EXT:  2 plus  pulses throughout, no edema, no cyanosis no clubbing   EKG:   Sinus rhythm, rate 66  axis within normal limits, intervals within normal limits, poor anterior R wave progression, no acute ST-T wave changes. 06/05/2018    ASSESSMENT AND PLAN  CAD (coronary artery disease) -  The patient has no new sypmtoms.  No further cardiovascular testing is indicated.  We will continue with aggressive risk reduction and meds as listed.  HLD (hyperlipidemia) -  His HDL was 40 and LDL 84 earlier this year.    For now I am good to have him continue his current medicines.  He is been sensitive to medications and reluctant to take a lot of medications.  I like him to continue to work on diet understanding that he is not quite at target.  HTN (hypertension) -    The blood pressure is minimally elevated but at home his blood pressure is well controlled.  No change in therapy.  At target. No change in medications is indicated. We will continue with therapeutic lifestyle changes (TLC).  DM -  A1C is 7.  He will continue the meds as listed.

## 2018-06-05 ENCOUNTER — Encounter: Payer: Self-pay | Admitting: Cardiology

## 2018-06-05 ENCOUNTER — Ambulatory Visit (INDEPENDENT_AMBULATORY_CARE_PROVIDER_SITE_OTHER): Payer: Medicare Other | Admitting: Cardiology

## 2018-06-05 VITALS — BP 148/80 | HR 66 | Ht 65.0 in | Wt 131.6 lb

## 2018-06-05 DIAGNOSIS — I214 Non-ST elevation (NSTEMI) myocardial infarction: Secondary | ICD-10-CM

## 2018-06-05 DIAGNOSIS — E785 Hyperlipidemia, unspecified: Secondary | ICD-10-CM

## 2018-06-05 DIAGNOSIS — I2510 Atherosclerotic heart disease of native coronary artery without angina pectoris: Secondary | ICD-10-CM | POA: Diagnosis not present

## 2018-06-05 DIAGNOSIS — I10 Essential (primary) hypertension: Secondary | ICD-10-CM | POA: Diagnosis not present

## 2018-06-05 NOTE — Patient Instructions (Signed)
Medication Instructions:  Continue current medications  If you need a refill on your cardiac medications before your next appointment, please call your pharmacy.  Labwork: None Ordered   Testing/Procedures: None Ordered   Follow-Up: Your physician wants you to follow-up in: 1 Year. You should receive a reminder letter in the mail two months in advance. If you do not receive a letter, please call our office to schedule your follow-up appointment.    Thank you for choosing [facility]!!

## 2018-06-07 ENCOUNTER — Other Ambulatory Visit (INDEPENDENT_AMBULATORY_CARE_PROVIDER_SITE_OTHER): Payer: Medicare Other

## 2018-06-07 DIAGNOSIS — I2510 Atherosclerotic heart disease of native coronary artery without angina pectoris: Secondary | ICD-10-CM

## 2018-06-07 DIAGNOSIS — I214 Non-ST elevation (NSTEMI) myocardial infarction: Secondary | ICD-10-CM

## 2018-06-07 DIAGNOSIS — I10 Essential (primary) hypertension: Secondary | ICD-10-CM

## 2018-09-27 ENCOUNTER — Other Ambulatory Visit: Payer: Self-pay | Admitting: Urology

## 2018-09-27 DIAGNOSIS — C61 Malignant neoplasm of prostate: Secondary | ICD-10-CM

## 2018-10-09 ENCOUNTER — Ambulatory Visit (HOSPITAL_COMMUNITY)
Admission: RE | Admit: 2018-10-09 | Discharge: 2018-10-09 | Disposition: A | Discharge status: Home / Self Care | Payer: Medicare Other | Source: Ambulatory Visit | Attending: Urology | Admitting: Urology

## 2018-10-09 DIAGNOSIS — C61 Malignant neoplasm of prostate: Secondary | ICD-10-CM | POA: Diagnosis not present

## 2018-10-09 MED ORDER — AXUMIN (FLUCICLOVINE F 18) INJECTION
10.5800 | Freq: Once | INTRAVENOUS | Status: AC
  Administered 2018-10-09: 10.58 via INTRAVENOUS

## 2018-11-24 DIAGNOSIS — C61 Malignant neoplasm of prostate: Secondary | ICD-10-CM

## 2018-11-24 HISTORY — DX: Malignant neoplasm of prostate: C61

## 2018-12-07 ENCOUNTER — Encounter: Payer: Self-pay | Admitting: *Deleted

## 2018-12-20 ENCOUNTER — Encounter: Payer: Self-pay | Admitting: Radiation Oncology

## 2018-12-20 DIAGNOSIS — C61 Malignant neoplasm of prostate: Secondary | ICD-10-CM | POA: Insufficient documentation

## 2018-12-20 NOTE — Progress Notes (Signed)
GU Location of Tumor / Histology: prostatic adenocarcinoma  If Prostate Cancer, Gleason Score is (4 + 5) and PSA is (18.4) on 09/05/2018. Prostate volume: 52 grams.   Keith [name] was referred by Dr Dineen Kid, MD to Dr. Jeffie Pollock in November 2019 for further evaluation of an elevated PSA. Patient's PSA was markedly elevated and a large left prostate nodule was noted. Patient denies LUTS at that time. Reports his was told his PSA was a 6 in 2017 but his PCP retired thus he didn't return for sometime to be reassessed.   Biopsies of prostate (if applicable) revealed:    Past/Anticipated interventions by urology, if any: biopsy, PET, Firmagon, referral for consideration of radiotherapy  Past/Anticipated interventions by medical oncology, if any: no  Weight changes, if any: no  Bowel/Bladder complaints, if any: IPSS 2. SHIM 1. Denies dysuria, hematuria, urinary leakage or incontinence.    Nausea/Vomiting, if any: no  Pain issues, if any:  no  SAFETY ISSUES:  Prior radiation? no  Pacemaker/ICD? no  Possible current pregnancy? no, male patient  Is the patient on methotrexate? no  Current Complaints / other details:  78 year old male. Married with one son and two daughters. Retired. NKDA. Denies any family hx of ca. Accompanied today by his son.

## 2018-12-21 ENCOUNTER — Encounter: Payer: Self-pay | Admitting: Radiation Oncology

## 2018-12-21 ENCOUNTER — Encounter: Payer: Self-pay | Admitting: Medical Oncology

## 2018-12-21 ENCOUNTER — Ambulatory Visit
Admission: RE | Admit: 2018-12-21 | Discharge: 2018-12-21 | Disposition: A | Discharge status: Home / Self Care | Payer: Medicare Other | Source: Ambulatory Visit | Attending: Radiation Oncology | Admitting: Radiation Oncology

## 2018-12-21 ENCOUNTER — Other Ambulatory Visit: Payer: Self-pay

## 2018-12-21 DIAGNOSIS — C61 Malignant neoplasm of prostate: Secondary | ICD-10-CM

## 2018-12-21 HISTORY — DX: Malignant neoplasm of prostate: C61

## 2018-12-21 NOTE — Progress Notes (Signed)
Radiation Oncology         [phone] ________________________________  Initial outpatient Consultation  Name: Keith [name] MRN: [id number]  Date: 12/21/2018  DOB: 01-22-1941  CC:Via, Lennette Bihari, MD  Irine Seal, MD   REFERRING PHYSICIAN: Irine Seal, MD  DIAGNOSIS: 78 y.o. 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    ICD-10-CM   1. Malignant neoplasm of prostate ([place]) C61     HISTORY OF PRESENT ILLNESS: Keith [name] is a 78 y.o. male with a diagnosis of prostate cancer. He was noted to have an elevated PSA of 18.4 on 09/05/18 by his primary care physician, Dr. Lynelle [name].  Accordingly, he was referred for evaluation in urology by Dr. Jeffie [name] on 09/18/18 ,  digital rectal examination was performed at that time revealing left lobe larger than right with a 20 mm nodule in the left mid gland, non-tender.  The patient proceeded to transrectal ultrasound with 12 biopsies of the prostate on 09/25/18.  The prostate volume measured 52 cc.  Out of 12 core biopsies, 5 were positive.  The maximum Gleason score was 4+5, and this was seen in the left base, left base lateral, left mid lateral and left apex lateral with a focus of ECE adjacent to the left seminal vesicle.  There was also Gleason 4+4 disease in the left mid gland.  He had an Axumin PET scan on 10/09/18 which showed increased radiotracer uptake in the prostate gland with a small nodule of ECE at the left bladder base.  There was no clear evidence of metastatic adenopathy but there was a 44mm left internal iliac node with faint activity. No evidence of osseous metastatic disease.  The patient reviewed the biopsy results with his urologist and he has kindly been referred today for discussion of potential radiation treatment options.  He was started on Firmagon 240 mg on 10/25/18. PSA was decreased to 6.85 on 11/05/18 with Testosterone at 10. He received his second dose of Firmagon on  12/05/18. Dr. Jeffie [name] plans to continue with monthly Firmagon injections due to patient's PMH of CAD with MI.  He is accompanied today by his son, Keith [name].   PREVIOUS RADIATION THERAPY: No  PAST MEDICAL HISTORY:  Past Medical History:  Diagnosis Date  . CAD (coronary artery disease)    a. NSTEMI 5/12: Xience DES x 2 to RCA; b. cath 5/12: mRCA 99% (tx with PCI); c. 07/2014 Abnl Poet;  d. 07/2014 Cath/PCI: LM 30d, LAD 40-50ost (FFR 0.92), 20p, D1 90 (2.5x22 Orsiro Biotronic study stent), LCX 10, RCA patent stents, PDA 30, EF 50.  Keith [name] DM2 (diabetes mellitus, type 2) ([place])   . HLD (hyperlipidemia)   . HTN (hypertension)   . Hypothyroidism   . Lung mass    LUL mass on chest CT 5/12:  stable on CT 2013  . Prostate cancer ([place])       PAST SURGICAL HISTORY: Past Surgical History:  Procedure Laterality Date  . LEFT HEART CATHETERIZATION WITH CORONARY ANGIOGRAM N/A 07/28/2014   Procedure: LEFT HEART CATHETERIZATION WITH CORONARY ANGIOGRAM;  Surgeon: Peter M Martinique, MD;  Location: [facility] CATH LAB;  Service: Cardiovascular;  Laterality: N/A;    FAMILY HISTORY:  Family History  Problem Relation Age of Onset  . Diabetes Mother   . Heart disease Mother   . Diabetes Father   . Heart disease Father   . Heart disease Brother     SOCIAL HISTORY:  Social History  Socioeconomic History  . Marital status: Married    Spouse name: Not on file  . Number of children: 3  . Years of education: Not on file  . Highest education level: Not on file  Occupational History  . Occupation: retired  Scientific laboratory technician  . Financial resource strain: Not on file  . Food insecurity:    Worry: Not on file    Inability: Not on file  . Transportation needs:    Medical: Not on file    Non-medical: Not on file  Tobacco Use  . Smoking status: Former Smoker    Packs/day: 0.30    Years: 4.00    Pack years: 1.20    Last attempt to quit: 10/25/1995    Years since quitting: 23.1  . Smokeless tobacco: Never Used  Substance and  Sexual Activity  . Alcohol use: No  . Drug use: No  . Sexual activity: Not Currently  Lifestyle  . Physical activity:    Days per week: Not on file    Minutes per session: Not on file  . Stress: Not on file  Relationships  . Social connections:    Talks on phone: Not on file    Gets together: Not on file    Attends religious service: Not on file    Active member of club or organization: Not on file    Attends meetings of clubs or organizations: Not on file    Relationship status: Not on file  . Intimate partner violence:    Fear of current or ex partner: Not on file    Emotionally abused: Not on file    Physically abused: Not on file    Forced sexual activity: Not on file  Other Topics Concern  . Not on file  Social History Narrative  . Not on file    ALLERGIES: Patient has no known allergies.  MEDICATIONS:  Current Outpatient Medications  Medication Sig Dispense Refill  . amLODipine (NORVASC) 5 MG tablet Take 1 tablet (5 mg total) by mouth daily. 30 tablet 9  . aspirin 81 MG tablet Take 81 mg by mouth daily.      Keith [name] glimepiride (AMARYL) 2 MG tablet Take 2 mg by mouth daily with breakfast.    . levothyroxine (SYNTHROID, LEVOTHROID) 75 MCG tablet TAKE 1 TABLET BY MOUTH ONCE DAILY ON AN EMPTY STOMACH IN THE MORNING FOR 90 DAYS    . losartan (COZAAR) 100 MG tablet Take 100 mg by mouth daily.    . metoprolol succinate (TOPROL-XL) 50 MG 24 hr tablet Take 50 mg by mouth daily. Take with or immediately following a meal.    . Multiple Vitamins-Minerals (CENTRUM SILVER PO) Take 1 tablet by mouth daily.    . nitroGLYCERIN (NITROSTAT) 0.4 MG SL tablet Place 1 tablet (0.4 mg total) under the tongue every 5 (five) minutes as needed for chest pain. 25 tablet 3  . pravastatin (PRAVACHOL) 80 MG tablet Take 80 mg by mouth daily.     No current facility-administered medications for this encounter.     REVIEW OF SYSTEMS:  On review of systems, the patient reports that he is doing well  overall. He denies any chest pain, shortness of breath, cough, fevers, chills, night sweats, unintended weight changes. He denies any bowel disturbances, and denies abdominal pain, nausea or vomiting. He denies any new musculoskeletal or joint aches or pains. His IPSS was 2, indicating very mild urinary symptoms. His SHIM was 1, indicating he severe erectile dysfunction. A complete  review of systems is obtained and is otherwise negative. He is accompanied by his son today.    PHYSICAL EXAM:  Wt Readings from Last 3 Encounters:  12/21/18 128 lb 3.2 oz (58.2 kg)  06/05/18 131 lb 9.6 oz (59.7 kg)  05/29/17 134 lb (60.8 kg)   Temp Readings from Last 3 Encounters:  12/21/18 97.6 F (36.4 C) (Oral)  07/29/14 98 F (36.7 C) (Oral)  02/15/12 98 F (36.7 C) (Oral)   BP Readings from Last 3 Encounters:  12/21/18 (!) 153/69  06/05/18 (!) 148/80  05/29/17 138/72   Pulse Readings from Last 3 Encounters:  12/21/18 65  06/05/18 66  05/29/17 (!) 59   Pain Assessment Pain Score: 0-No pain/10  In general this is a well appearing [place] gentleman in no acute distress. He is alert and oriented x4 and appropriate throughout the examination. HEENT reveals that the patient is normocephalic, atraumatic. EOMs are intact. PERRLA. Skin is intact without any evidence of gross lesions. Cardiovascular exam reveals a regular rate and rhythm, no clicks rubs or murmurs are auscultated. Chest is clear to auscultation bilaterally. Lymphatic assessment is performed and does not reveal any adenopathy in the cervical, supraclavicular, axillary, or inguinal chains. Abdomen has active bowel sounds in all quadrants and is intact. The abdomen is soft, non tender, non distended. Lower extremities are negative for pretibial pitting edema, deep calf tenderness, cyanosis or clubbing.   KPS = 100  100 - Normal; no complaints; no evidence of disease. 90   - Able to carry on normal activity; minor signs or symptoms of  disease. 80   - Normal activity with effort; some signs or symptoms of disease. 50   - Cares for self; unable to carry on normal activity or to do active work. 60   - Requires occasional assistance, but is able to care for most of his personal needs. 50   - Requires considerable assistance and frequent medical care. 76   - Disabled; requires special care and assistance. 5   - Severely disabled; hospital admission is indicated although death not imminent. 14   - Very sick; hospital admission necessary; active supportive treatment necessary. 10   - Moribund; fatal processes progressing rapidly. 0     - Dead  Karnofsky DA, Abelmann [employer], Craver LS and Burchenal [facility] [phone]) The use of the nitrogen mustards in the palliative treatment of carcinoma: with particular reference to bronchogenic carcinoma Cancer 1 634-56  LABORATORY DATA:  Lab Results  Component Value Date   WBC 7.9 07/29/2014   HGB 14.6 07/29/2014   HCT 40.6 07/29/2014   MCV 90.0 07/29/2014   PLT 233 07/29/2014   Lab Results  Component Value Date   NA 137 07/29/2014   K 4.4 07/29/2014   CL 101 07/29/2014   CO2 20 07/29/2014   No results found for: ALT, AST, GGT, ALKPHOS, BILITOT   RADIOGRAPHY: No results found.    IMPRESSION/PLAN: 1. 78 y.o. 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 We discussed the patient's workup and outlines the nature of prostate cancer in this setting. The patient's T stage, Gleason's score, and PSA put him into the high risk group. Accordingly, he is eligible for LT-ADT in combination with 8 weeks of external radiation. We discussed the available radiation techniques, and focused on the details of logistics and delivery. We discussed and outlined the risks, benefits, short and long-term effects associated with radiotherapy and compared and  contrasted these with prostatectomy. We discussed the role of SpaceOAR in reducing the rectal toxicity  associated with radiotherapy. He was started on Firmagon in January 2020 and has received two doses which he is tolerating well.  At the end of the conversation, the patient is interested in moving forward with 8 weeks of external beam therapy in combination with LT-ADT. We will contact [facility] to make arrangements for fiducial markers and placement of SpaceOAR gel to reduce rectal toxicity from radiotherapy prior to simulation. He will be scheduled for simulation in the near future. We will share our discussion with Dr. Jeffie [name] and move forward with treatment planning in anticipation of beginning IMRT in the near future.  We spent 60 minutes face to face with the patient and more than 50% of that time was spent in counseling and/or coordination of care.    Nicholos Johns, PA-C    Tyler Pita, MD  [facility] Direct Dial: [phone]  Fax: [phone] [email or web address]  Skype  LinkedIn   This document serves as a record of services personally performed by Tyler Pita, MD and Freeman Caldron, PA-C. It was created on their behalf by Wilburn Mylar, a trained medical scribe. The creation of this record is based on the scribe's personal observations and the provider's statements to them. This document has been checked and approved by the attending provider.

## 2018-12-21 NOTE — Progress Notes (Signed)
See progress note from physician encounter.  

## 2019-01-07 ENCOUNTER — Telehealth: Payer: Self-pay | Admitting: Cardiology

## 2019-01-07 NOTE — Telephone Encounter (Signed)
Left message (on both home and cell phone) for the patient to call back and speak to the on call preop APP.   Since it is prostate CA, will try to clear the patient as soon as possible. As long as he is able to complete 4 METS of activity, will proceed with surgery

## 2019-01-07 NOTE — Telephone Encounter (Signed)
New message      [facility] Pre-operative Risk Assessment    Request for surgical clearance:  1. What type of surgery is being performed? Orchiectomy   2. When is this surgery scheduled? TBD  3. What type of clearance is required (medical clearance vs. Pharmacy clearance to hold med vs. Both)? medical  4. Are there any medications that need to be held prior to surgery and how long?n/a  5. Practice name and name of physician performing surgery? [facility], Dr. Irine Seal  6. What is your office phone number [phone] ext [phone]   7.   What is your office fax number [phone]  8.   Anesthesia type (None, local, MAC, general) ? General   Keith [name] 01/07/2019, 3:14 PM  _________________________________________________________________   (provider comments below)

## 2019-01-09 NOTE — Telephone Encounter (Signed)
   Primary Cardiologist: [name], MD  Chart reviewed as part of pre-operative protocol coverage. Given past medical history and time since last visit, based on ACC/AHA guidelines, Keith [name] would be at acceptable risk for the planned procedure without further cardiovascular testing.   I will route this recommendation to the requesting party via Epic fax function and remove from pre-op pool.  Please call with questions.  [place], [place] 01/09/2019, 10:35 AM

## 2019-03-07 ENCOUNTER — Encounter: Payer: Self-pay | Admitting: Urology

## 2019-03-07 NOTE — Progress Notes (Signed)
Per communication with Romie Jumper, this patient has elected to proceed with LT-ADT in the form of orchiectomy and will not be proceeding with radiation.  Nicholos Johns, MMS, PA-C [facility]: [phone]  Fax: [phone]

## 2019-04-15 ENCOUNTER — Other Ambulatory Visit: Payer: Self-pay | Admitting: Urology

## 2019-04-15 DIAGNOSIS — E559 Vitamin D deficiency, unspecified: Secondary | ICD-10-CM

## 2019-05-03 ENCOUNTER — Telehealth: Payer: Self-pay | Admitting: *Deleted

## 2019-05-03 NOTE — Telephone Encounter (Signed)
BIOFLOW V Research study: left message for son to call research office for the last follow up telephone call.

## 2019-05-07 ENCOUNTER — Other Ambulatory Visit: Payer: Self-pay | Admitting: Urology

## 2019-05-16 ENCOUNTER — Encounter: Payer: Self-pay | Admitting: *Deleted

## 2019-05-16 DIAGNOSIS — Z006 Encounter for examination for normal comparison and control in clinical research program: Secondary | ICD-10-CM

## 2019-05-16 NOTE — Research (Signed)
BIOFLOW V Research study 5 year follow up completed. I spoke with patient's son, and he stated "father is doing well, no hospital admissions or other AE's to report No changes in medication". He states he father has not had any chest pain. I thanked him for his dad's participation in the study.  This concludes the follow up for the BIOFLOW V stent study.

## 2019-06-12 ENCOUNTER — Ambulatory Visit
Admission: RE | Admit: 2019-06-12 | Discharge: 2019-06-12 | Disposition: A | Discharge status: Home / Self Care | Payer: Medicare Other | Source: Ambulatory Visit | Attending: Urology | Admitting: Urology

## 2019-06-12 ENCOUNTER — Other Ambulatory Visit: Payer: Self-pay

## 2019-06-12 DIAGNOSIS — E559 Vitamin D deficiency, unspecified: Secondary | ICD-10-CM

## 2019-06-17 ENCOUNTER — Encounter (HOSPITAL_COMMUNITY)
Admission: RE | Admit: 2019-06-17 | Discharge: 2019-06-17 | Disposition: A | Discharge status: Home / Self Care | Payer: Medicare Other | Source: Ambulatory Visit | Attending: Urology | Admitting: Urology

## 2019-06-17 ENCOUNTER — Encounter (HOSPITAL_COMMUNITY): Payer: Self-pay

## 2019-06-17 ENCOUNTER — Other Ambulatory Visit: Payer: Self-pay

## 2019-06-17 ENCOUNTER — Other Ambulatory Visit (HOSPITAL_COMMUNITY)
Admission: RE | Admit: 2019-06-17 | Discharge: 2019-06-17 | Disposition: A | Discharge status: Home / Self Care | Payer: Medicare Other | Source: Ambulatory Visit | Attending: Urology | Admitting: Urology

## 2019-06-17 DIAGNOSIS — Z87891 Personal history of nicotine dependence: Secondary | ICD-10-CM | POA: Diagnosis not present

## 2019-06-17 DIAGNOSIS — E559 Vitamin D deficiency, unspecified: Secondary | ICD-10-CM | POA: Diagnosis not present

## 2019-06-17 DIAGNOSIS — Z20828 Contact with and (suspected) exposure to other viral communicable diseases: Secondary | ICD-10-CM | POA: Insufficient documentation

## 2019-06-17 DIAGNOSIS — I252 Old myocardial infarction: Secondary | ICD-10-CM | POA: Diagnosis not present

## 2019-06-17 DIAGNOSIS — Z7982 Long term (current) use of aspirin: Secondary | ICD-10-CM | POA: Diagnosis not present

## 2019-06-17 DIAGNOSIS — E119 Type 2 diabetes mellitus without complications: Secondary | ICD-10-CM | POA: Diagnosis not present

## 2019-06-17 DIAGNOSIS — Z79899 Other long term (current) drug therapy: Secondary | ICD-10-CM | POA: Diagnosis not present

## 2019-06-17 DIAGNOSIS — N448 Other noninflammatory disorders of the testis: Secondary | ICD-10-CM | POA: Diagnosis not present

## 2019-06-17 DIAGNOSIS — E7800 Pure hypercholesterolemia, unspecified: Secondary | ICD-10-CM | POA: Diagnosis not present

## 2019-06-17 DIAGNOSIS — E118 Type 2 diabetes mellitus with unspecified complications: Secondary | ICD-10-CM | POA: Insufficient documentation

## 2019-06-17 DIAGNOSIS — C61 Malignant neoplasm of prostate: Secondary | ICD-10-CM | POA: Diagnosis not present

## 2019-06-17 DIAGNOSIS — Z01818 Encounter for other preprocedural examination: Secondary | ICD-10-CM | POA: Insufficient documentation

## 2019-06-17 DIAGNOSIS — I10 Essential (primary) hypertension: Secondary | ICD-10-CM | POA: Insufficient documentation

## 2019-06-17 LAB — BASIC METABOLIC PANEL
Anion gap: 8 (ref 5–15)
BUN: 19 mg/dL (ref 8–23)
CO2: 24 mmol/L (ref 22–32)
Calcium: 9.9 mg/dL (ref 8.9–10.3)
Chloride: 106 mmol/L (ref 98–111)
Creatinine, Ser: 1.05 mg/dL (ref 0.61–1.24)
GFR calc Af Amer: >60 mL/min (ref >60)
GFR calc non Af Amer: >60 mL/min (ref >60)
Glucose, Bld: 134 mg/dL — ABNORMAL HIGH (ref 70–99)
Potassium: 5.2 mmol/L — ABNORMAL HIGH (ref 3.5–5.1)
Sodium: 138 mmol/L (ref 135–145)

## 2019-06-17 LAB — CBC
HCT: 41.3 % (ref 39.0–52.0)
Hemoglobin: 13.9 g/dL (ref 13.0–17.0)
MCH: 32 pg (ref 26.0–34.0)
MCHC: 33.7 g/dL (ref 30.0–36.0)
MCV: 94.9 fL (ref 80.0–100.0)
Platelets: 251 10*3/uL (ref 150–400)
RBC: 4.35 MIL/uL (ref 4.22–5.81)
RDW: 12.7 % (ref 11.5–15.5)
WBC: 5.6 10*3/uL (ref 4.0–10.5)
nRBC: 0 % (ref 0.0–0.2)

## 2019-06-17 LAB — HEMOGLOBIN A1C
Hgb A1c MFr Bld: 6.9 % — ABNORMAL HIGH (ref 4.8–5.6)
Mean Plasma Glucose: 151.33 mg/dL

## 2019-06-17 LAB — GLUCOSE, CAPILLARY: Glucose-Capillary: 181 mg/dL — ABNORMAL HIGH (ref 70–99)

## 2019-06-17 NOTE — Progress Notes (Addendum)
PCP - [facility] has new dr not sure who Cardiologist - dr Percival Spanish, cardiology clearance bhagat bhavinkumar pa 01-09-19 epic  Chest x-ray -  EKG - 06-17-19 epic Stress Test - exercise tolerance test 06-07-14 epic ECHO -  Cardiac Cath - 07-28-14 epic  LABS DONE 06-17-19 CBC, BMET, HEAMGLOBIN A1C  EPIC Sleep Study -  CPAP -   Fasting Blood Sugar -  Checks Blood Sugar once a month  Blood Thinner Instructions: Aspirin Instructions: stopped aspirin 81 mg 06-14-19 per dr Jeffie Pollock instructions Last Dose:  Anesthesia review:   Patient denies shortness of breath, fever, cough and chest pain at PAT appointment   Patient verbalized understanding of instructions that were given to them at the PAT appointment. Patient was also instructed that they will need to review over the PAT instructions again at home before surgery.

## 2019-06-17 NOTE — Patient Instructions (Addendum)
YOU NEED TO HAVE A COVID 19 TEST ON8-24-2020 at 245. THIS TEST MUST BE DONE BEFORE SURGERY, COME  [address], [place] , [id number]. ONCE YOUR COVID TEST IS COMPLETED, PLEASE BEGIN THE QUARANTINE INSTRUCTIONS AS OUTLINED IN YOUR HANDOUT.                Keith [name]    Your procedure is scheduled on: 06-20-2019  Report to [facility] Main  Entrance  Report to admitting at 1000 AM   [address] [place]. NO VISITORS ARE ALLOWED IN SHORT STAY OR RECOVERY ROOM.   Call this number if you have problems the morning of surgery [phone]    Remember: Do not eat food or drink liquids :After Midnight. BRUSH YOUR TEETH MORNING OF SURGERY AND RINSE YOUR MOUTH OUT, NO CHEWING GUM CANDY OR MINTS.     Please read over the following fact sheets you were giv   Take these medicines the morning of surgery with A SIP OF WATER: metorpolol succinate, amlodipine (norvasc), levothyroxine (synthroid), simvastatin (zocor)  DO NOT TAKE ANY DIABETIC MEDICATIONS DAY OF YOUR SURGERY    How to Manage Your Diabetes Before and After Surgery  Why is it important to control my blood sugar before and after surgery? . Improving blood sugar levels before and after surgery helps healing and can limit problems. . A way of improving blood sugar control is eating a healthy diet by: o  Eating less sugar and carbohydrates o  Increasing activity/exercise o  Talking with your doctor about reaching your blood sugar goals . High blood sugars (greater than 180 mg/dL) can raise your risk of infections and slow your recovery, so you will need to focus on controlling your diabetes during the weeks before surgery. . Make sure that the doctor who takes care of your diabetes knows about your planned surgery including the date and location.  How do I manage my blood sugar before surgery? . Check your blood sugar at least 4 times a day, starting 2 days before  surgery, to make sure that the level is not too high or low. o Check your blood sugar the morning of your surgery when you wake up and every 2 hours until you get to the Short Stay unit. . If your blood sugar is less than 70 mg/dL, you will need to treat for low blood sugar: o Do not take insulin. o Treat a low blood sugar (less than 70 mg/dL) with  cup of clear juice (cranberry or apple), 4 glucose tablets, OR glucose gel. o Recheck blood sugar in 15 minutes after treatment (to make sure it is greater than 70 mg/dL). If your blood sugar is not greater than 70 mg/dL on recheck, call [phone] for further instructions. . Report your blood sugar to the short stay nurse when you get to Short Stay.  . If you are admitted to the hospital after surgery: o Your blood sugar will be checked by the staff and you will probably be given insulin after surgery (instead of oral diabetes medicines) to make sure you have good blood sugar levels. o The goal for blood sugar control after surgery is 80-180 mg/dL.   WHAT DO I DO ABOUT MY DIABETES MEDICATION?  Marland Kitchen Do not take oral diabetes medicines (pills) the morning of surgery.  . THE DAY  BEFORE SURGERY TAKE GLIMPERIDE AS USUAL.     . THE MORNING  OF SURGERY DO NOT TAKE GLIMPERIDE.                       You may not have any metal on your body including hair pins and              piercings  Do not wear jewelry, make-up, lotions, powders or perfumes, deodorant                        Men may shave face and neck.   Do not bring valuables to the hospital. [place].  Contacts, dentures or bridgework may not be worn into surgery.  Leave suitcase in the car. After surgery it may be brought to your room.     Patients discharged the day of surgery will not be allowed to drive home. IF YOU ARE HAVING SURGERY AND GOING HOME THE SAME DAY, YOU MUST HAVE AN ADULT TO DRIVE YOU HOME AND BE WITH YOU FOR 24 HOURS. YOU  MAY GO HOME BY TAXI OR UBER OR ORTHERWISE, BUT AN ADULT MUST ACCOMPANY YOU HOME AND STAY WITH YOU FOR 24 HOURS.  Name and phone number of your driver: Keith [name] cell [phone]  Special Instructions: N/A      _____________________________________________________________________             [facility] - Preparing for Surgery Before surgery, you can play an important role.  Because skin is not sterile, your skin needs to be as free of germs as possible.  You can reduce the number of germs on your skin by washing with CHG (chlorahexidine gluconate) soap before surgery.  CHG is an antiseptic cleaner which kills germs and bonds with the skin to continue killing germs even after washing. Please DO NOT use if you have an allergy to CHG or antibacterial soaps.  If your skin becomes reddened/irritated stop using the CHG and inform your nurse when you arrive at Short Stay. Do not shave (including legs and underarms) for at least 48 hours prior to the first CHG shower.  You may shave your face/neck. Please follow these instructions carefully:  1.  Shower with CHG Soap the night before surgery and the  morning of Surgery.  2.  If you choose to wash your hair, wash your hair first as usual with your  normal  shampoo.  3.  After you shampoo, rinse your hair and body thoroughly to remove the  shampoo.                           4.  Use CHG as you would any other liquid soap.  You can apply chg directly  to the skin and wash                       Gently with a scrungie or clean washcloth.  5.  Apply the CHG Soap to your body ONLY FROM THE NECK DOWN.   Do not use on face/ open                           Wound or open sores. Avoid contact with eyes, ears mouth and genitals (private parts).  Wash face,  Genitals (private parts) with your normal soap.             6.  Wash thoroughly, paying special attention to the area where your surgery  will be performed.  7.  Thoroughly rinse your  body with warm water from the neck down.  8.  DO NOT shower/wash with your normal soap after using and rinsing off  the CHG Soap.                9.  Pat yourself dry with a clean towel.            10.  Wear clean pajamas.            11.  Place clean sheets on your bed the night of your first shower and do not  sleep with pets. Day of Surgery : Do not apply any lotions/deodorants the morning of surgery.  Please wear clean clothes to the hospital/surgery center.  FAILURE TO FOLLOW THESE INSTRUCTIONS MAY RESULT IN THE CANCELLATION OF YOUR SURGERY PATIENT SIGNATURE_________________________________  NURSE SIGNATURE__________________________________  ________________________________________________________________________

## 2019-06-18 LAB — SARS CORONAVIRUS 2 (TAT 6-24 HRS): SARS Coronavirus 2: NEGATIVE

## 2019-06-19 NOTE — H&P (Signed)
CC/HPI: Pt presents today for pre-operative history and physical exam in anticipation of bilateral orchiectomy on 06/20/19 by Dr. Jeffie Pollock. He is doing well and is without complaint. Pt denies F/C, HA, CP, SOB, N/V, diarrhea/constipation, back pain, flank pain, hematuria, and dysuria.   His cardiologist is Dr. Percival Spanish and his last eval was approx 1 year ago. He has a routine f/u with cards in Sept.    HX:     CC/HPI: I have prostate cancer.     Keith [name] returns in f/u for his history of prostate cancer. He is currently on firmagon with a further decline in the PSA to 0.16 with a castrate testosterone. His PSA was 18.4 prior to treatment. He was interested in bilateral orchiectomy but has been on firmagon because of COVID. He is doing well without pain, weight loss or voiding difficulty. His IPSS is 3. He has been seen by Tammi Klippel for consideration of radiation therapy.   He has extensive Gleason 9(4+5) cancer in 5/6 cores in the left prostate with extracapsular involvement adjacent to the left SV. An Axumin PET shows uptake in the prostate with a small nodule at the left bladder base and a possible left iliac node with some uptake. His PSA was 18.4. He has T3/4 N0/1 M0 prostate cancer.    ALLERGIES: None   MEDICATIONS: Levothyroxine Sodium 50 mcg tablet  Metoprolol Succinate 50 mg tablet, extended release 24 hr  Amlodipine Besylate 5 mg tablet  Aspirin Ec 81 mg tablet, delayed release  Glimepiride 2 mg tablet  Losartan Potassium 100 mg tablet  Pravastatin Sodium 80 mg tablet     GU PSH: Prostate Needle Biopsy - 09/25/2018     NON-GU PSH: Cardiac Stent Placement Surgical Pathology, Gross And Microscopic Examination For Prostate Needle - 09/25/2018 Tonsillectomy     GU PMH: Prostate Cancer, He is doing well on firmagon but he wants to be changed to orchiectomy for ADT. I have reviewed the risks previously. I will get him scheduled. - 04/15/2019, (Improving), He has had a good initial  response to the firmagon. I will continue ADT with firmagon because of his history of MI. I also reviewed orchiectomy should he want to go that route. He will see me in 3 months with a PSA, T and Vit D and will come for monthly firmagon , - 12/05/2018, He has T3/4 High risk, Gleason 9 prostate cancer with a possible left iliac node but no distant mets. I discussed the options for therapy including primary ADT vs ADT x 2 years with EXRT. I reviewed the risks as noted in the plan and also discussed the need for SpaceOAR and fiducial markers should he proceed with EXRT and ADT. I am going to have him return for Firmagon 240mg  and will transition to Lupron 45mg  in a month with a repeat T and PSA. Baseline T and Vit D level today. I will consider Dexascanning going forward. , - 10/11/2018 Metastatic pelvic lymphadenopathy - 10/11/2018 Elevated PSA - 09/25/2018, He has a markedly elevated PSA with a large left prostate nodule but no symptoms. I am going to get him set up for a prostate [place] and Bx and reviewed the risks of bleeding, infection and voiding difficulty. , - 09/18/2018 Prostate nodule w/o LUTS - 09/18/2018    NON-GU PMH: Vitamin D deficiency, unspecified, I will get a dexascan. - 04/15/2019 Restlessness and agitation, He has been chatty and quick to anger which might be related to ADT. His son doesn't think he would  consider a psychological evaluation but I told him to see if the PCP might be able to give him something for agitation. - 12/05/2018 Diabetes Type 2 Hypercholesterolemia Hypertension Myocardial Infarction    FAMILY HISTORY: None   SOCIAL HISTORY: Marital Status: Married Current Smoking Status: Patient does not smoke anymore.   Tobacco Use Assessment Completed: Used Tobacco in last 30 days? Does not use smokeless tobacco. Has never drank.  Does not use drugs. Drinks 1 caffeinated drink per day. Has not had a blood transfusion.     Notes: 1 son, 2 daughters    REVIEW OF SYSTEMS:     GU Review Male:   Patient reports get up at night to urinate. Patient denies frequent urination, hard to postpone urination, burning/ pain with urination, leakage of urine, stream starts and stops, trouble starting your stream, have to strain to urinate , erection problems, and penile pain.  Gastrointestinal (Upper):   Patient denies nausea, vomiting, and indigestion/ heartburn.  Gastrointestinal (Lower):   Patient denies diarrhea and constipation.  Constitutional:   Patient denies fever, night sweats, weight loss, and fatigue.  Skin:   Patient denies skin rash/ lesion and itching.  Eyes:   Patient denies blurred vision and double vision.  Ears/ Nose/ Throat:   Patient denies sore throat and sinus problems.  Hematologic/Lymphatic:   Patient denies swollen glands and easy bruising.  Cardiovascular:   Patient denies leg swelling and chest pains.  Respiratory:   Patient denies cough and shortness of breath.  Endocrine:   Patient denies excessive thirst.  Musculoskeletal:   Patient denies back pain and joint pain.  Neurological:   Patient denies headaches and dizziness.  Psychologic:   Patient denies depression and anxiety.   VITAL SIGNS:      06/04/2019 03:02 PM  Weight 123 lb / 55.79 kg  Height 65 in / 165.1 cm  BP 146/69 mmHg  Pulse 60 /min  Temperature 98.0 F / 36.6 C  BMI 20.5 kg/m   MULTI-SYSTEM PHYSICAL EXAMINATION:    Constitutional: Well-nourished. No physical deformities. Normally developed. Good grooming.  Neck: Neck symmetrical, not swollen. Normal tracheal position.  Respiratory: Normal breath sounds. No labored breathing, no use of accessory muscles.   Cardiovascular: Regular rate and rhythm. No murmur, no gallop. Normal temperature, normal extremity pulses, no swelling, no varicosities.   Lymphatic: No enlargement of neck, axillae, groin.  Skin: No paleness, no jaundice, no cyanosis. No lesion, no ulcer, no rash.  Neurologic / Psychiatric: Oriented to time, oriented to  place, oriented to person. No depression, no anxiety, no agitation.  Gastrointestinal: No mass, no tenderness, no rigidity, non obese abdomen.  Eyes: Normal conjunctivae. Normal eyelids.  Ears, Nose, Mouth, and Throat: Left ear no scars, no lesions, no masses. Right ear no scars, no lesions, no masses. Nose no scars, no lesions, no masses. Normal hearing. Normal lips.  Musculoskeletal: Normal gait and station of head and neck.     PAST DATA REVIEWED:  Source Of History:  Patient  Records Review:   Previous Patient Records  Urine Test Review:   Urinalysis   06/04/19  Urinalysis  Urine Appearance Clear   Urine Color Yellow   Urine Glucose Neg mg/dL  Urine Bilirubin Neg mg/dL  Urine Ketones Neg mg/dL  Urine Specific Gravity 1.025   Urine Blood Neg ery/uL  Urine pH 6.0   Urine Protein Trace mg/dL  Urine Urobilinogen 0.2 mg/dL  Urine Nitrites Neg   Urine Leukocyte Esterase Neg leu/uL   PROCEDURES:  Urinalysis - 81003 Dipstick Dipstick Cont'd  Color: Yellow Bilirubin: Neg mg/dL  Appearance: Clear Ketones: Neg mg/dL  Specific Gravity: 1.025 Blood: Neg ery/uL  pH: 6.0 Protein: Trace mg/dL  Glucose: Neg mg/dL Urobilinogen: 0.2 mg/dL    Nitrites: Neg    Leukocyte Esterase: Neg leu/uL    ASSESSMENT:      ICD-10 Details  1 GU:   Prostate Cancer - C61    PLAN:           Schedule Return Visit/Planned Activity: Keep Scheduled Appointment - Schedule Surgery          Document Letter(s):  Created for Patient: Clinical Summary         Notes:   There are no changes in the patients history or physical exam since last evaluation by Dr. Jeffie Pollock. Pt is scheduled to undergo bilateral orchiectomy on 06/20/19.   All pt's questions were answered to the best of my ability.          Next Appointment:      Next Appointment: 06/20/2019 12:00 PM    Appointment Type: Surgery     Location: [facility] [phone]    Provider: Irine Seal, M.D.    Reason for Visit:  OP [facility]

## 2019-06-19 NOTE — Anesthesia Preprocedure Evaluation (Addendum)
Anesthesia Evaluation  Patient identified by MRN, date of birth, ID band Patient awake    Reviewed: Allergy & Precautions, NPO status , Patient's Chart, lab work & pertinent test results  Airway Mallampati: II  TM Distance: >3 FB Neck ROM: Full    Dental no notable dental hx.    Pulmonary former smoker,    Pulmonary exam normal breath sounds clear to auscultation       Cardiovascular hypertension, Pt. on medications and Pt. on home beta blockers + CAD, + Past MI and + Cardiac Stents  Normal cardiovascular exam Rhythm:Regular Rate:Normal  ECG: rate 57, Sinus bradycardia with sinus arrhythmia   Neuro/Psych negative neurological ROS  negative psych ROS   GI/Hepatic negative GI ROS, Neg liver ROS,   Endo/Other  diabetes, Oral Hypoglycemic AgentsHypothyroidism   Renal/GU negative Renal ROS     Musculoskeletal negative musculoskeletal ROS (+)   Abdominal   Peds  Hematology HLD   Anesthesia Other Findings PROSTATE CANCER  Reproductive/Obstetrics                           Anesthesia Physical Anesthesia Plan  ASA: III  Anesthesia Plan: General   Post-op Pain Management:    Induction: Intravenous  PONV Risk Score and Plan: 3 and Ondansetron, Dexamethasone, Midazolam and Treatment may vary due to age or medical condition  Airway Management Planned: LMA  Additional Equipment:   Intra-op Plan:   Post-operative Plan: Extubation in OR  Informed Consent: I have reviewed the patients History and Physical, chart, labs and discussed the procedure including the risks, benefits and alternatives for the proposed anesthesia with the patient or authorized representative who has indicated his/her understanding and acceptance.     Dental advisory given  Plan Discussed with: CRNA  Anesthesia Plan Comments: (Reviewed PAT note 06/17/2019, Konrad Felix, PA-C)      Anesthesia Quick Evaluation

## 2019-06-19 NOTE — Progress Notes (Signed)
Anesthesia Chart Review   Case: [id number] Date/Time: 06/20/19 1145   Procedure: ORCHIECTOMY (Bilateral )   Anesthesia type: General   Pre-op diagnosis: PROSTATE CANCER   Location: [facility]   Surgeon: Keith Seal, MD      DISCUSSION:78 y.o. former smoker (1.2 pack years, quit 10/25/95) with h/o CAD (NSTEMI, DES 2012), HTN, HLD, hypothyroidism, DM II, prostate cancer scheduled for above procedure 06/20/2019 with Dr. Irine [name].   Low risk stress test 06/07/2017.  Asymptomatic at PAT visit.   Cleared by cardiology 01/09/2019.  Per Keith Kail, PA-C, "Given past medical history and time since last visit, based on ACC/AHA guidelines, Keith [name] would be at acceptable risk for the planned procedure without further cardiovascular testing."  Anticipate pt can proceed with planned procedure barring acute status change.   VS: BP (!) 157/65   Pulse (!) 58   Temp 37 C (Oral)   Resp 18   Ht 5\' 5"  (1.651 m)   Wt 56.2 kg   SpO2 100%   BMI 20.63 kg/m   PROVIDERS: ViaLennette Bihari, MD is PCP   Keith Breeding, MD is Cardiologist  LABS: Labs reviewed: Acceptable for surgery. (all labs ordered are listed, but only abnormal results are displayed)  Labs Reviewed  GLUCOSE, CAPILLARY - Abnormal; Notable for the following components:      Result Value   Glucose-Capillary 181 (*)    All other components within normal limits  HEMOGLOBIN A1C - Abnormal; Notable for the following components:   Hgb A1c MFr Bld 6.9 (*)    All other components within normal limits  BASIC METABOLIC PANEL - Abnormal; Notable for the following components:   Potassium 5.2 (*)    Glucose, Bld 134 (*)    All other components within normal limits  CBC     IMAGES:   EKG: 06/17/2019 Rate 57 bpm Sinus bradycardia with sinus arrhythmia Otherwise normal EKG  Similar to previous tracings   CV: Stress Test 06/07/2017  Blood pressure demonstrated a normal response to exercise.  Horizontal ST  segment depression ST segment depression of 1 mm was noted during stress in the II, III, aVF, V5 and V6 leads, and returning to baseline after less than 1 minute of recovery.   1. Good exercise tolerance.  2. Approximately 1 mm horizontal ST depression in the inferior leads and V5/V6.  ST depression more prominent in inferior leads.  ST depression resolved < 1 minute into recovery.   This is an equivocal study.  The ST depression was most marked in the inferior leads, which are nonspecific.  ST depression recovered rapidly.  Exercise tolerance was good, suggesting low overal risk.   Low risk study. No clear evidence for ischemia and his symptoms were vague. No further work up at this point. Tell him to call me with any change in symptoms.  Call Mr. Keith [name] with the results and send results to Keith Spurling, MD Past Medical History:  Diagnosis Date  . CAD (coronary artery disease)    a. NSTEMI 5/12: Xience DES x 2 to RCA; b. cath 5/12: mRCA 99% (tx with PCI); c. 07/2014 Abnl Po[facility];  d. 07/2014 Cath/PCI: LM 30d, LAD 40-50ost (FFR 0.92), 20p, D1 90 (2.5x22 [employer] study stent), LCX 10, RCA patent stents, PDA 30, EF 50.  Marland Kitchen DM2 (diabetes mellitus, type 2) ([place])    type 2  . HLD (hyperlipidemia)   . HTN (hypertension)   . Hypothyroidism   . Lung  mass    LUL mass on chest CT 5/12:  stable on CT 2013  . Prostate cancer ([place]) 11/2018    Past Surgical History:  Procedure Laterality Date  . LEFT HEART CATHETERIZATION WITH CORONARY ANGIOGRAM N/A 07/28/2014   Procedure: LEFT HEART CATHETERIZATION WITH CORONARY ANGIOGRAM;  Surgeon: Keith M Martinique, MD;  Location: [facility] CATH LAB;  Service: Cardiovascular;  Laterality: N/A;  . stent to heart x 2  2015    MEDICATIONS: . amLODipine (NORVASC) 5 MG tablet  . aspirin 81 MG tablet  . glimepiride (AMARYL) 2 MG tablet  . levothyroxine (SYNTHROID) 50 MCG tablet  . losartan (COZAAR) 100 MG tablet  . metoprolol succinate (TOPROL-XL) 50 MG 24 hr  tablet  . Multiple Vitamins-Minerals (CENTRUM SILVER PO)  . nitroGLYCERIN (NITROSTAT) 0.4 MG SL tablet  . simvastatin (ZOCOR) 80 MG tablet   No current facility-administered medications for this encounter.     Keith [name] [facility] Pre-Surgical Testing [phone] 06/19/19  9:54 AM

## 2019-06-20 ENCOUNTER — Ambulatory Visit (HOSPITAL_COMMUNITY): Payer: Medicare Other | Admitting: Physician Assistant

## 2019-06-20 ENCOUNTER — Encounter (HOSPITAL_COMMUNITY): Payer: Self-pay

## 2019-06-20 ENCOUNTER — Encounter (HOSPITAL_COMMUNITY)
Admission: RE | Disposition: A | Discharge status: Home / Self Care | Payer: Self-pay | Source: Home / Self Care | Attending: Urology

## 2019-06-20 ENCOUNTER — Ambulatory Visit (HOSPITAL_COMMUNITY): Payer: Medicare Other | Admitting: Certified Registered"

## 2019-06-20 ENCOUNTER — Ambulatory Visit (HOSPITAL_COMMUNITY)
Admission: RE | Admit: 2019-06-20 | Discharge: 2019-06-20 | Disposition: A | Discharge status: Home / Self Care | Payer: Medicare Other | Attending: Urology | Admitting: Urology

## 2019-06-20 DIAGNOSIS — E7800 Pure hypercholesterolemia, unspecified: Secondary | ICD-10-CM | POA: Insufficient documentation

## 2019-06-20 DIAGNOSIS — N448 Other noninflammatory disorders of the testis: Secondary | ICD-10-CM | POA: Insufficient documentation

## 2019-06-20 DIAGNOSIS — Z7982 Long term (current) use of aspirin: Secondary | ICD-10-CM | POA: Insufficient documentation

## 2019-06-20 DIAGNOSIS — E559 Vitamin D deficiency, unspecified: Secondary | ICD-10-CM | POA: Insufficient documentation

## 2019-06-20 DIAGNOSIS — Z79899 Other long term (current) drug therapy: Secondary | ICD-10-CM | POA: Diagnosis not present

## 2019-06-20 DIAGNOSIS — Z87891 Personal history of nicotine dependence: Secondary | ICD-10-CM | POA: Insufficient documentation

## 2019-06-20 DIAGNOSIS — Z20828 Contact with and (suspected) exposure to other viral communicable diseases: Secondary | ICD-10-CM | POA: Insufficient documentation

## 2019-06-20 DIAGNOSIS — C61 Malignant neoplasm of prostate: Secondary | ICD-10-CM | POA: Diagnosis not present

## 2019-06-20 DIAGNOSIS — I252 Old myocardial infarction: Secondary | ICD-10-CM | POA: Insufficient documentation

## 2019-06-20 DIAGNOSIS — E119 Type 2 diabetes mellitus without complications: Secondary | ICD-10-CM | POA: Insufficient documentation

## 2019-06-20 HISTORY — PX: ORCHIECTOMY: SHX2116

## 2019-06-20 LAB — GLUCOSE, CAPILLARY
Glucose-Capillary: 118 mg/dL — ABNORMAL HIGH (ref 70–99)
Glucose-Capillary: 126 mg/dL — ABNORMAL HIGH (ref 70–99)

## 2019-06-20 SURGERY — ORCHIECTOMY
Anesthesia: General | Laterality: Bilateral

## 2019-06-20 MED ORDER — LIDOCAINE 2% (20 MG/ML) 5 ML SYRINGE
INTRAMUSCULAR | Status: AC
  Filled 2019-06-20: qty 5

## 2019-06-20 MED ORDER — HYDROCODONE-ACETAMINOPHEN 5-325 MG PO TABS: 1.0000 | ORAL_TABLET | Freq: Four times a day (QID) | ORAL | 0 refills | Status: DC | PRN

## 2019-06-20 MED ORDER — ONDANSETRON HCL 4 MG/2ML IJ SOLN
INTRAMUSCULAR | Status: DC | PRN
  Administered 2019-06-20: 4 mg via INTRAVENOUS

## 2019-06-20 MED ORDER — SODIUM CHLORIDE 0.9% FLUSH: 3.0000 mL | Freq: Two times a day (BID) | INTRAVENOUS | Status: DC

## 2019-06-20 MED ORDER — BUPIVACAINE HCL 0.25 % IJ SOLN
INTRAMUSCULAR | Status: DC | PRN
  Administered 2019-06-20: 10 mL

## 2019-06-20 MED ORDER — SODIUM CHLORIDE 0.9 % IV SOLN
INTRAVENOUS | Status: DC | PRN
  Administered 2019-06-20: 11:00:00 40 ug/min via INTRAVENOUS

## 2019-06-20 MED ORDER — FENTANYL CITRATE (PF) 100 MCG/2ML IJ SOLN
INTRAMUSCULAR | Status: AC
  Filled 2019-06-20: qty 2

## 2019-06-20 MED ORDER — DEXAMETHASONE SODIUM PHOSPHATE 10 MG/ML IJ SOLN
INTRAMUSCULAR | Status: DC | PRN
  Administered 2019-06-20: 8 mg via INTRAVENOUS

## 2019-06-20 MED ORDER — GLYCOPYRROLATE PF 0.2 MG/ML IJ SOSY
PREFILLED_SYRINGE | INTRAMUSCULAR | Status: DC | PRN
  Administered 2019-06-20: .2 mg via INTRAVENOUS

## 2019-06-20 MED ORDER — PHENYLEPHRINE 40 MCG/ML (10ML) SYRINGE FOR IV PUSH (FOR BLOOD PRESSURE SUPPORT)
PREFILLED_SYRINGE | INTRAVENOUS | Status: AC
  Filled 2019-06-20: qty 10

## 2019-06-20 MED ORDER — FENTANYL CITRATE (PF) 100 MCG/2ML IJ SOLN
INTRAMUSCULAR | Status: DC | PRN
  Administered 2019-06-20 (×2): 50 ug via INTRAVENOUS
  Administered 2019-06-20: 25 ug via INTRAVENOUS

## 2019-06-20 MED ORDER — ONDANSETRON HCL 4 MG/2ML IJ SOLN: 4.0000 mg | Freq: Once | INTRAMUSCULAR | Status: DC | PRN

## 2019-06-20 MED ORDER — BUPIVACAINE HCL (PF) 0.25 % IJ SOLN
INTRAMUSCULAR | Status: AC
  Filled 2019-06-20: qty 30

## 2019-06-20 MED ORDER — PROPOFOL 10 MG/ML IV BOLUS
INTRAVENOUS | Status: AC
  Filled 2019-06-20: qty 40

## 2019-06-20 MED ORDER — LIDOCAINE 2% (20 MG/ML) 5 ML SYRINGE
INTRAMUSCULAR | Status: DC | PRN
  Administered 2019-06-20: 80 mg via INTRAVENOUS

## 2019-06-20 MED ORDER — GLYCOPYRROLATE PF 0.2 MG/ML IJ SOSY
PREFILLED_SYRINGE | INTRAMUSCULAR | Status: AC
  Filled 2019-06-20: qty 1

## 2019-06-20 MED ORDER — FENTANYL CITRATE (PF) 100 MCG/2ML IJ SOLN: 25.0000 ug | INTRAMUSCULAR | Status: DC | PRN

## 2019-06-20 MED ORDER — LACTATED RINGERS IV SOLN
INTRAVENOUS | Status: DC
  Administered 2019-06-20: 10:00:00 via INTRAVENOUS

## 2019-06-20 MED ORDER — ACETAMINOPHEN 500 MG PO TABS
1000.0000 mg | ORAL_TABLET | Freq: Once | ORAL | Status: AC
  Administered 2019-06-20: 10:00:00 1000 mg via ORAL
  Filled 2019-06-20: qty 2

## 2019-06-20 MED ORDER — PROPOFOL 10 MG/ML IV BOLUS
INTRAVENOUS | Status: DC | PRN
  Administered 2019-06-20: 200 mg via INTRAVENOUS

## 2019-06-20 MED ORDER — CEFAZOLIN SODIUM-DEXTROSE 2-4 GM/100ML-% IV SOLN
2.0000 g | INTRAVENOUS | Status: AC
  Administered 2019-06-20: 11:00:00 2 g via INTRAVENOUS
  Filled 2019-06-20: qty 100

## 2019-06-20 SURGICAL SUPPLY — 27 items
BENZOIN TINCTURE PRP APPL 2/3 (GAUZE/BANDAGES/DRESSINGS) ×1 IMPLANT
BLADE HEX COATED 2.75 (ELECTRODE) ×1 IMPLANT
BNDG GAUZE ELAST 4 BULKY (GAUZE/BANDAGES/DRESSINGS) ×3 IMPLANT
COVER WAND RF STERILE (DRAPES) IMPLANT
DECANTER SPIKE VIAL GLASS SM (MISCELLANEOUS) ×3 IMPLANT
DRAIN PENROSE 18X1/2 LTX STRL (DRAIN) ×1 IMPLANT
DRAIN PENROSE 18X1/4 LTX STRL (WOUND CARE) ×3 IMPLANT
DRAPE LAPAROTOMY T 98X78 PEDS (DRAPES) ×2 IMPLANT
ELECT REM PT RETURN 15FT ADLT (MISCELLANEOUS) ×3 IMPLANT
GAUZE SPONGE 4X4 12PLY STRL (GAUZE/BANDAGES/DRESSINGS) ×3 IMPLANT
GLOVE SURG SS PI 8.0 STRL IVOR (GLOVE) ×2 IMPLANT
GOWN STRL REUS W/TWL XL LVL3 (GOWN DISPOSABLE) ×3 IMPLANT
KIT BASIN OR (CUSTOM PROCEDURE TRAY) ×3 IMPLANT
KIT TURNOVER KIT A (KITS) ×2 IMPLANT
NEEDLE HYPO 22GX1.5 SAFETY (NEEDLE) ×2 IMPLANT
NS IRRIG 1000ML POUR BTL (IV SOLUTION) ×2 IMPLANT
PACK GENERAL/GYN (CUSTOM PROCEDURE TRAY) ×3 IMPLANT
SPONGE LAP 4X18 RFD (DISPOSABLE) ×4 IMPLANT
SUPPORT SCROTAL LG STRP (MISCELLANEOUS) ×2 IMPLANT
SUPPORTER ATHLETIC LG (MISCELLANEOUS) ×1
SUT CHROMIC 3 0 SH 27 (SUTURE) ×4 IMPLANT
SUT CHROMIC 4 0 SH 27 (SUTURE) IMPLANT
SUT VIC AB 3-0 SH 27 (SUTURE) ×2
SUT VIC AB 3-0 SH 27XBRD (SUTURE) ×1 IMPLANT
SUT VICRYL 0 TIES 12 18 (SUTURE) ×3 IMPLANT
SYR CONTROL 10ML LL (SYRINGE) ×2 IMPLANT
WATER STERILE IRR 1000ML POUR (IV SOLUTION) IMPLANT

## 2019-06-20 NOTE — Discharge Instructions (Signed)
HOME CARE INSTRUCTIONS FOR [facility]OTAL PROCEDURES  Wound Care & Hygiene: You may apply an ice bag to the scrotum for the first 24 hours.  This may help decrease swelling and soreness.  You may have a dressing held in place by an athletic supporter.  You may remove the dressing in 24 hours and shower in 48 hours.  Continue to use the athletic supporter or tight briefs for at least a week. Activity: Rest today - not necessarily flat bed rest.  Just take it easy.  You should not do strenuous activities until your follow-up visit with your doctor.  You may resume light activity in 48 hours.  Return to Work:  Your doctor will advise you of this depending on the type of work you do  Diet: Drink liquids or eat a light diet this evening.  You may resume a regular diet tomorrow.  General Expectations: You may have a small amount of bleeding.  The scrotum may be swollen or bruised for about a week.  Call your Doctor if these occur:  -persistent or heavy bleeding  -temperature of 101 degrees or more  -severe pain, not relieved by your pain medication    Patient Signature:  __________________________________________________  Nurse's Signature:  __________________________________________________

## 2019-06-20 NOTE — Anesthesia Postprocedure Evaluation (Signed)
Anesthesia Post Note  Patient: Keith [name]  Procedure(s) Performed: ORCHIECTOMY (Bilateral )     Patient location during evaluation: PACU Anesthesia Type: General Level of consciousness: awake and alert Pain management: pain level controlled Vital Signs Assessment: post-procedure vital signs reviewed and stable Respiratory status: spontaneous breathing, nonlabored ventilation, respiratory function stable and patient connected to nasal cannula oxygen Cardiovascular status: blood pressure returned to baseline and stable Postop Assessment: no apparent nausea or vomiting Anesthetic complications: no    Last Vitals:  Vitals:   06/20/19 1240 06/20/19 1250  BP: 134/71 (!) 142/69  Pulse: 70 74  Resp: 12 16  Temp:  36.4 C  SpO2: 100% 98%    Last Pain:  Vitals:   06/20/19 1250  TempSrc:   PainSc: 0-No pain                 Tayna Smethurst P Gabrial Poppell

## 2019-06-20 NOTE — Transfer of Care (Signed)
Immediate Anesthesia Transfer of Care Note  Patient: Keith [name]  Procedure(s) Performed: ORCHIECTOMY (Bilateral )  Patient Location: PACU  Anesthesia Type:General  Level of Consciousness: awake, patient cooperative and responds to stimulation  Airway & Oxygen Therapy: Patient Spontanous Breathing and Patient connected to face mask oxygen  Post-op Assessment: Report given to RN and Post -op Vital signs reviewed and stable  Post vital signs: Reviewed and stable  Last Vitals:  Vitals Value Taken Time  BP 140/76 06/20/19 1207  Temp    Pulse 89 06/20/19 1209  Resp 11 06/20/19 1209  SpO2 100 % 06/20/19 1209  Vitals shown include unvalidated device data.  Last Pain:  Vitals:   06/20/19 1013  TempSrc:   PainSc: 0-No pain         Complications: No apparent anesthesia complications

## 2019-06-20 NOTE — Anesthesia Procedure Notes (Signed)
Procedure Name: Intubation Date/Time: 06/20/2019 11:12 AM Performed by: Silas Sacramento, CRNA Pre-anesthesia Checklist: Patient identified, Emergency Drugs available, Suction available and Patient being monitored Patient Re-evaluated:Patient Re-evaluated prior to induction Oxygen Delivery Method: Circle system utilized Preoxygenation: Pre-oxygenation with 100% oxygen Induction Type: IV induction LMA: LMA with gastric port inserted LMA Size: 4.0 Tube type: Oral Number of attempts: 1 Airway Equipment and Method: Stylet and Oral airway Placement Confirmation: positive ETCO2 and breath sounds checked- equal and bilateral Tube secured with: Tape Dental Injury: Teeth and Oropharynx as per pre-operative assessment

## 2019-06-20 NOTE — Op Note (Signed)
Procedure: Bilateral scrotal orchiectomy.  Preop diagnosis: Prostate cancer.  Postop diagnosis: Same.  Surgeon: Dr. Irine Seal.  Anesthesia: General.  Specimen: Right and left testes.  Drain: None.  EBL: None.  Complications: None.  Indications: The patient is a 78 year old male with prostate cancer on chronic androgen deprivation therapy.  He is elected to proceed with bilateral orchiectomy.  Procedure: He was given 2 g of Ancef.  General anesthetic was induced with him in the supine position.  He was fitted with PAS hose.  The scrotum was clipped.  He was prepped with Betadine solution and draped in usual sterile fashion.  A midline anterior scrotal incision approximately 3 cm in length was made in the median raphae with a knife.  This was carried to the dartos fascia with the Bovie.  The left testicle was delivered into the wound and the tunica vaginalis was opened.  The tunics was dissected down to expose the cord.  A cord block was performed with quarter percent Marcaine without epinephrine with approximately 3 mL injected.  The cord was then divided into 2 packets which were clamped with hemostats and the testicle was removed.  Each packet was doubly ligated with 0 Vicryl ties.  Once hemostasis was assured the cord stump was returned to the left hemiscrotum.  The right testicle was removed in an identical fashion with an additional 3 mL of Marcaine injected for cord block.  The wound was inspected and no active bleeding was noted.  The dartos was closed with a running 3-0 chromic suture.  The skin was then closed with a running vertical mattress 3-0 chromic suture.  An additional 4 mL of quarter percent Marcaine were injected beneath the wound.  A dressing of 4 x 4's, fluff Kerlix and athletic supporter was applied.  His anesthetic was reversed and he was moved recovery in stable condition.  There were no complications.

## 2019-06-20 NOTE — Interval H&P Note (Signed)
History and Physical Interval Note:  06/20/2019 10:41 AM  Keith [name]  has presented today for surgery, with the diagnosis of PROSTATE CANCER.  The various methods of treatment have been discussed with the patient and family. After consideration of risks, benefits and other options for treatment, the patient has consented to  Procedure(s): ORCHIECTOMY (Bilateral) as a surgical intervention.  The patient's history has been reviewed, patient examined, no change in status, stable for surgery.  I have reviewed the patient's chart and labs.  Questions were answered to the patient's satisfaction.     Irine Seal

## 2019-06-21 ENCOUNTER — Encounter (HOSPITAL_COMMUNITY): Payer: Self-pay | Admitting: Urology

## 2019-07-10 NOTE — Progress Notes (Signed)
HPI The patient presents for evaluation of CAD.  In the fall of 2015 he had an abnormal POET (Plain Old Exercise Treadmill) without symptoms.  He had PCI of a diagonal vessel. He had a POET (Plain Old Exercise Treadmill) last year and this was negative for ischemia.    Since I last saw him he had an orchiectomy for prostate cancer.  He denies any cardiovascular symptoms.  The patient denies any new symptoms such as chest discomfort, neck or arm discomfort. There has been no new shortness of breath, PND or orthopnea. There have been no reported palpitations, presyncope or syncope.  He walks a little bit but he is worried about damaging his knees so he does not walk very much.  No Known Allergies  Current Outpatient Medications  Medication Sig Dispense Refill  . amLODipine (NORVASC) 5 MG tablet Take 1 tablet (5 mg total) by mouth daily. 30 tablet 9  . aspirin 81 MG tablet Take 81 mg by mouth daily.      Marland Kitchen glimepiride (AMARYL) 2 MG tablet Take 2 mg by mouth daily with breakfast.    . levothyroxine (SYNTHROID) 50 MCG tablet Take 50 mcg by mouth daily before breakfast.     . losartan (COZAAR) 100 MG tablet Take 100 mg by mouth daily.    . metoprolol succinate (TOPROL-XL) 50 MG 24 hr tablet Take 50 mg by mouth daily. Take with or immediately following a meal.    . Multiple Vitamins-Minerals (CENTRUM SILVER PO) Take 1 tablet by mouth daily.    . nitroGLYCERIN (NITROSTAT) 0.4 MG SL tablet Place 1 tablet (0.4 mg total) under the tongue every 5 (five) minutes as needed for chest pain. 25 tablet 3  . pravastatin (PRAVACHOL) 80 MG tablet Take 80 mg by mouth daily.     No current facility-administered medications for this visit.     Past Medical History:  Diagnosis Date  . CAD (coronary artery disease)    a. NSTEMI 5/12: Xience DES x 2 to RCA; b. cath 5/12: mRCA 99% (tx with PCI); c. 07/2014 Abnl Poet;  d. 07/2014 Cath/PCI: LM 30d, LAD 40-50ost (FFR 0.92), 20p, D1 90 (2.5x22 Orsiro Biotronic  study stent), LCX 10, RCA patent stents, PDA 30, EF 50.  Marland Kitchen DM2 (diabetes mellitus, type 2) ([place])    type 2  . HLD (hyperlipidemia)   . HTN (hypertension)   . Hypothyroidism   . Lung mass    LUL mass on chest CT 5/12:  stable on CT 2013  . Prostate cancer ([place]) 11/2018    Past Surgical History:  Procedure Laterality Date  . LEFT HEART CATHETERIZATION WITH CORONARY ANGIOGRAM N/A 07/28/2014   Procedure: LEFT HEART CATHETERIZATION WITH CORONARY ANGIOGRAM;  Surgeon: Peter M Martinique, MD;  Location: [facility] CATH LAB;  Service: Cardiovascular;  Laterality: N/A;  . ORCHIECTOMY Bilateral 06/20/2019   Procedure: ORCHIECTOMY;  Surgeon: Irine Seal, MD;  Location: [facility] ORS;  Service: Urology;  Laterality: Bilateral;  . stent to heart x 2  2015    ROS:  As stated in the HPI and negative for all other systems.  PHYSICAL EXAM BP (!) 156/78   Pulse 60   Ht 5\' 5"  (1.651 m)   Wt 128 lb 3.2 oz (58.2 kg)   SpO2 98%   BMI 21.33 kg/m   GENERAL:  Well appearing NECK:  No jugular venous distention, waveform within normal limits, carotid upstroke brisk and symmetric, no bruits, no thyromegaly LUNGS:  Clear to auscultation bilaterally  CHEST:  Unremarkable HEART:  PMI not displaced or sustained,S1 and S2 within normal limits, no S3, no S4, no clicks, no rubs, no murmurs ABD:  Flat, positive bowel sounds normal in frequency in pitch, no bruits, no rebound, no guarding, no midline pulsatile mass, no hepatomegaly, no splenomegaly EXT:  2 plus pulses throughout, no edema, no cyanosis no clubbing   EKG:   Sinus rhythm, rate 57  axis within normal limits, intervals within normal limits, poor anterior R wave progression, no acute ST-T wave changes. 06/17/19    ASSESSMENT AND PLAN  CAD (coronary artery disease) -  The patient has no new sypmtoms.  No further cardiovascular testing is indicated.  We will continue with aggressive risk reduction and meds as listed.  HLD (hyperlipidemia) -  His HDL was 40 and LDL 84  earlier this year. No change in therapy.   HTN (hypertension) -  The blood pressure is elevated.  However, he reports is well controlled at home.  Is can keep a blood pressure diary and go up on his amlodipine if his blood pressure is not well controlled.   DM -  A1C is 6.9.  No change in therapy.

## 2019-07-11 ENCOUNTER — Other Ambulatory Visit: Payer: Self-pay

## 2019-07-11 ENCOUNTER — Ambulatory Visit (INDEPENDENT_AMBULATORY_CARE_PROVIDER_SITE_OTHER): Payer: Medicare Other | Admitting: Cardiology

## 2019-07-11 ENCOUNTER — Encounter: Payer: Self-pay | Admitting: Cardiology

## 2019-07-11 VITALS — BP 156/78 | HR 60 | Ht 65.0 in | Wt 128.2 lb

## 2019-07-11 DIAGNOSIS — I10 Essential (primary) hypertension: Secondary | ICD-10-CM | POA: Diagnosis not present

## 2019-07-11 DIAGNOSIS — E785 Hyperlipidemia, unspecified: Secondary | ICD-10-CM

## 2019-07-11 DIAGNOSIS — I2510 Atherosclerotic heart disease of native coronary artery without angina pectoris: Secondary | ICD-10-CM | POA: Diagnosis not present

## 2019-07-11 NOTE — Patient Instructions (Signed)
Medication Instructions:  Your physician recommends that you continue on your current medications as directed. Please refer to the Current Medication list given to you today.  If you need a refill on your cardiac medications before your next appointment, please call your pharmacy.   Lab work: NONE  Testing/Procedures: NONE  Follow-Up: At [facility], you and your health needs are our priority.  As part of our continuing mission to provide you with exceptional heart care, we have created designated Provider Care Teams.  These Care Teams include your primary Cardiologist (physician) and Advanced Practice Providers (APPs -  Physician Assistants and Nurse Practitioners) who all work together to provide you with the care you need, when you need it. You will need a follow up appointment in 12 months.  Please call our office 2 months in advance to schedule this appointment.  You may see [name], MD or one of the following Advanced Practice Providers on your designated Care Team:   [name], PA-C [name], DNP, ANP  Any Other Special Instructions Will Be Listed Below (If Applicable). Keep a blood pressure diary. Take your blood pressure 4x's a week for 2 weeks. Mail your results to:  [address] [place] [id number]

## 2019-10-08 ENCOUNTER — Telehealth: Payer: Self-pay | Admitting: *Deleted

## 2019-10-08 NOTE — Telephone Encounter (Signed)
Dr Percival Spanish received blood pressure readings and reviewed  Averaging from 120's-140's/60's-70's Continue current medications, no change at this time Left message to call back

## 2019-10-10 NOTE — Telephone Encounter (Signed)
Advised son, ok per [facility]

## 2020-05-29 ENCOUNTER — Telehealth: Payer: Self-pay | Admitting: Cardiology

## 2020-05-29 NOTE — Telephone Encounter (Signed)
LVM for patient to return call to get follow up scheduled with Hochrein from recall list 

## 2020-07-27 DIAGNOSIS — Z7189 Other specified counseling: Secondary | ICD-10-CM | POA: Insufficient documentation

## 2020-07-27 NOTE — Progress Notes (Signed)
Cardiology Office Note   Date:  07/28/2020   ID:  Keith [name], DOB Mar 10, 1941, MRN [id number]  PCP:  Dineen Kid, Keith [name]  Cardiologist:   [name], Keith [name]   Chief Complaint  Patient presents with   Coronary Artery Disease      History of Present Illness: Keith [name] is a 79 y.o. male who presents for evaluation of CAD.  In the fall of 2015 he had an abnormal POET (Plain Old Exercise Treadmill) without symptoms.  He had PCI of a diagonal vessel. He had a POET (Plain Old Exercise Treadmill) last year and this was negative for ischemia.    Since I last saw him he has done very well.  He has had some knee pain.  With that he has not been able to exercise as much as before but he still walking with a cane.  He is going to get management of that.  He denies any chest pressure, neck or arm discomfort.  He denies any shortness of breath, PND or orthopnea.  He has had no palpitations, presyncope or syncope.  He has had no weight gain or edema.   Past Medical History:  Diagnosis Date   CAD (coronary artery disease)    a. NSTEMI 5/12: Xience DES x 2 to RCA; b. cath 5/12: mRCA 99% (tx with PCI); c. 07/2014 Abnl Poet;  d. 07/2014 Cath/PCI: LM 30d, LAD 40-50ost (FFR 0.92), 20p, D1 90 (2.5x22 Orsiro Biot[employer] study stent), LCX 10, RCA patent stents, PDA 30, EF 50.   DM2 (diabetes mellitus, type 2) ([place])    type 2   HLD (hyperlipidemia)    HTN (hypertension)    Hypothyroidism    Lung mass    LUL mass on chest CT 5/12:  stable on CT 2013   Prostate cancer ([place]) 11/2018    Past Surgical History:  Procedure Laterality Date   LEFT HEART CATHETERIZATION WITH CORONARY ANGIOGRAM N/A 07/28/2014   Procedure: LEFT HEART CATHETERIZATION WITH CORONARY ANGIOGRAM;  Surgeon: Peter M Martinique, Keith [name];  Location: [facility] CATH LAB;  Service: Cardiovascular;  Laterality: N/A;   ORCHIECTOMY Bilateral 06/20/2019   Procedure: ORCHIECTOMY;  Surgeon: Irine Seal, Keith [name];  Location: [facility] ORS;  Service:  Urology;  Laterality: Bilateral;   stent to heart x 2  2015     Current Outpatient Medications  Medication Sig Dispense Refill   amLODipine (NORVASC) 5 MG tablet Take 1 tablet (5 mg total) by mouth daily. 30 tablet 9   aspirin 81 MG tablet Take 81 mg by mouth daily.       glimepiride (AMARYL) 2 MG tablet Take 4 mg by mouth daily with breakfast.      levothyroxine (SYNTHROID) 50 MCG tablet Take 75 mcg by mouth daily before breakfast.      losartan (COZAAR) 100 MG tablet Take 100 mg by mouth daily.     metoprolol succinate (TOPROL-XL) 50 MG 24 hr tablet Take 50 mg by mouth daily. Take with or immediately following a meal.     Multiple Vitamins-Minerals (CENTRUM SILVER PO) Take 1 tablet by mouth daily.     nitroGLYCERIN (NITROSTAT) 0.4 MG SL tablet Place 1 tablet (0.4 mg total) under the tongue every 5 (five) minutes as needed for chest pain. 25 tablet 3   pravastatin (PRAVACHOL) 80 MG tablet Take 80 mg by mouth daily.     No current facility-administered medications for this visit.    Allergies:   Patient has no known allergies.   ROS:  Please see the history of present illness.   Otherwise, review of systems are positive for none.   All other systems are reviewed and negative.    PHYSICAL EXAM: VS:  BP 140/73    Pulse 74    Ht 5\' 5"  (1.651 m)    Wt 131 lb (59.4 kg)    SpO2 100%    BMI 21.80 kg/m  , BMI Body mass index is 21.8 kg/m. GENERAL:  Well appearing NECK:  No jugular venous distention, waveform within normal limits, carotid upstroke brisk and symmetric, no bruits, no thyromegaly LUNGS:  Clear to auscultation bilaterally CHEST:  Unremarkable HEART:  PMI not displaced or sustained,S1 and S2 within normal limits, no S3, no S4, no clicks, no rubs, no murmurs ABD:  Flat, positive bowel sounds normal in frequency in pitch, no bruits, no rebound, no guarding, no midline pulsatile mass, no hepatomegaly, no splenomegaly EXT:  2 plus pulses throughout, no edema, no cyanosis  no clubbing   EKG:  EKG is ordered today. The ekg ordered today demonstrates sinus rhythm, rate 74, axis within normal limits, intervals within normal limits, no acute ST-T wave changes.   Recent Labs: No results found for requested labs within last 8760 hours.    Lipid Panel    Component Value Date/Time   CHOL 172 03/10/2011 0730   TRIG 160 (H) 03/10/2011 0730   HDL 44 03/10/2011 0730   CHOLHDL 3.9 03/10/2011 0730   VLDL 32 03/10/2011 0730   LDLCALC  03/10/2011 0730    96        Total Cholesterol/HDL:CHD Risk Coronary Heart Disease Risk Table                     Men   Women  1/2 Average Risk   3.4   3.3  Average Risk       5.0   4.4  2 X Average Risk   9.6   7.1  3 X Average Risk  23.4   11.0        Use the calculated Patient Ratio above and the CHD Risk Table to determine the patient's CHD Risk.        ATP III CLASSIFICATION (LDL):  <100     mg/dL   Optimal  100-129  mg/dL   Near or Above                    Optimal  130-159  mg/dL   Borderline  160-189  mg/dL   High  >190     mg/dL   Very High      Wt Readings from Last 3 Encounters:  07/28/20 131 lb (59.4 kg)  07/11/19 128 lb 3.2 oz (58.2 kg)  06/20/19 124 lb (56.2 kg)      Other studies Reviewed: Additional studies/ records that were reviewed today include: Labs. Review of the above records demonstrates:  Please see elsewhere in the note.     ASSESSMENT AND PLAN:  CAD (coronary artery disease) -  The patient has no new sypmtoms.  No further cardiovascular testing is indicated.  We will continue with aggressive risk reduction and meds as listed.  HLD (hyperlipidemia) -  I do not have the most recent labs.  He says that they are controlled.  I will get a copy from his primary provider.   HTN (hypertension) -  The blood pressure is is at target.  No change in therapy.  DM -   A1c  was 7.9 most recently but he just had his medicines increased and we talked about goals of therapy.  I will defer to  Via, Keith Bihari, Keith [name]   [place] - He has had his vaccine and we talked about the booster today.  Current medicines are reviewed at length with the patient today.  The patient does not have concerns regarding medicines.  The following changes have been made:  no change  Labs/ tests ordered today include:  No orders of the defined types were placed in this encounter.    Disposition:   FU with me in 12 months.     Signed, [name], Keith [name]  07/28/2020 3:36 PM    [facility]

## 2020-07-28 ENCOUNTER — Other Ambulatory Visit: Payer: Self-pay

## 2020-07-28 ENCOUNTER — Ambulatory Visit (INDEPENDENT_AMBULATORY_CARE_PROVIDER_SITE_OTHER): Payer: Medicare Other | Admitting: Cardiology

## 2020-07-28 ENCOUNTER — Encounter: Payer: Self-pay | Admitting: Cardiology

## 2020-07-28 VITALS — BP 140/73 | HR 74 | Ht 65.0 in | Wt 131.0 lb

## 2020-07-28 DIAGNOSIS — Z7189 Other specified counseling: Secondary | ICD-10-CM

## 2020-07-28 DIAGNOSIS — I10 Essential (primary) hypertension: Secondary | ICD-10-CM | POA: Diagnosis not present

## 2020-07-28 DIAGNOSIS — E785 Hyperlipidemia, unspecified: Secondary | ICD-10-CM | POA: Diagnosis not present

## 2020-07-28 DIAGNOSIS — I2510 Atherosclerotic heart disease of native coronary artery without angina pectoris: Secondary | ICD-10-CM | POA: Diagnosis not present

## 2020-07-28 DIAGNOSIS — E118 Type 2 diabetes mellitus with unspecified complications: Secondary | ICD-10-CM

## 2020-07-28 MED ORDER — NITROGLYCERIN 0.4 MG SL SUBL: 0.4000 mg | SUBLINGUAL_TABLET | SUBLINGUAL | 3 refills | Status: AC | PRN

## 2020-07-28 NOTE — Patient Instructions (Signed)
Medication Instructions:  Your Physician recommend you continue on your current medication as directed.    *If you need a refill on your cardiac medications before your next appointment, please call your pharmacy*   Lab Work: None ordered  Testing/Procedures: None ordered    Follow-Up: At [facility], you and your health needs are our priority.  As part of our continuing mission to provide you with exceptional heart care, we have created designated Provider Care Teams.  These Care Teams include your primary Cardiologist (physician) and Advanced Practice Providers (APPs -  Physician Assistants and Nurse Practitioners) who all work together to provide you with the care you need, when you need it.  We recommend signing up for the patient portal called "MyChart".  Sign up information is provided on this After Visit Summary.  MyChart is used to connect with patients for Virtual Visits (Telemedicine).  Patients are able to view lab/test results, encounter notes, upcoming appointments, etc.  Non-urgent messages can be sent to your provider as well.   To learn more about what you can do with MyChart, go to [email or web address].    Your next appointment:   1 year(s)  The format for your next appointment:   In Person  Provider:   [name], MD

## 2020-07-30 NOTE — Addendum Note (Signed)
Addended by: Wonda Horner on: 07/30/2020 12:23 PM   Modules accepted: Orders

## 2020-09-15 IMAGING — CT NM PET NOPR SKULL BASE TO THIGH
8 series · 25 of 25 positions shown · non-contrast
Comparison: FDG PET scan 02/22/2012

CLINICAL DATA: Prostate carcinoma with biochemical recurrence. PSA
equal

EXAM:
NUCLEAR MEDICINE PET SKULL BASE TO THIGH
TECHNIQUE: 10.6 mCi F-18 Fluciclovine was injected intravenously. Full-ring PET
imaging was performed from the skull base to thigh after the
radiotracer. CT data was obtained and used for attenuation
correction and anatomic localization.

[Series 3: pet sk_thigh ac · axial · 5.0mm · 4.07mm/px · z∈[+957,+1805]mm · 6 of 213 slices shown]
[im 1/213]
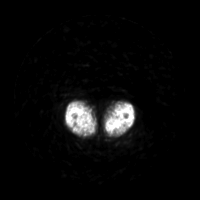
[im 43/213]
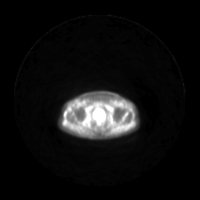
[im 85/213]
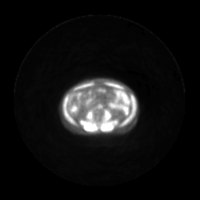
[im 128/213]
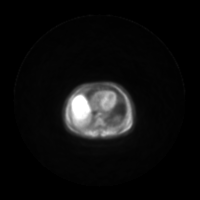
[im 170/213]
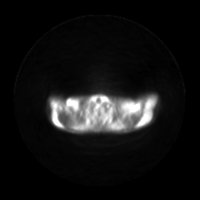
[im 213/213]
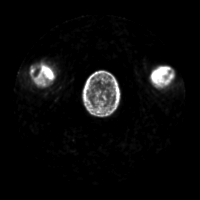

[Series 4: ct sk_thigh 5.0 b31f · axial · 5.0mm · 0.98mm/px · z∈[+957,+1805]mm · 5 of 213 slices shown]
[im 1/213]
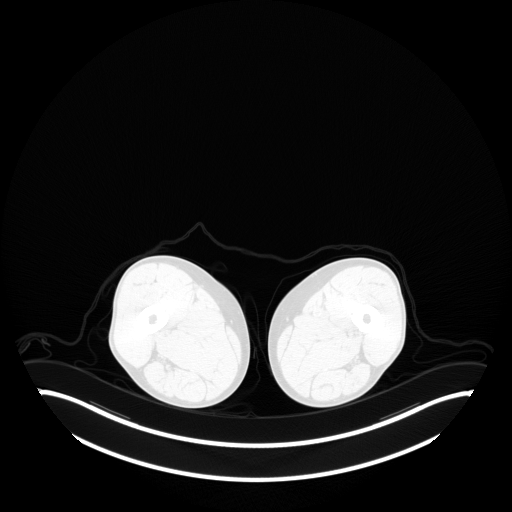
[im 54/213]
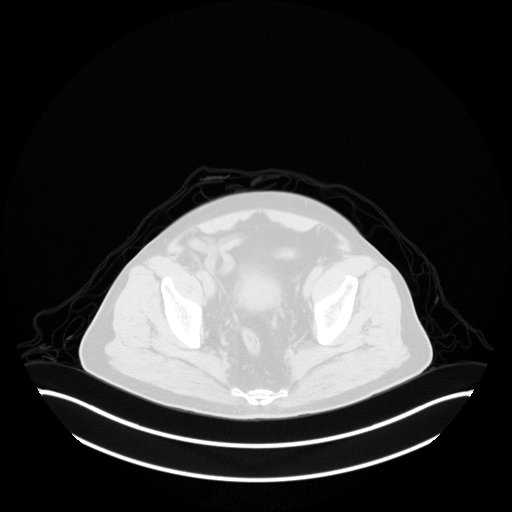
[im 107/213]
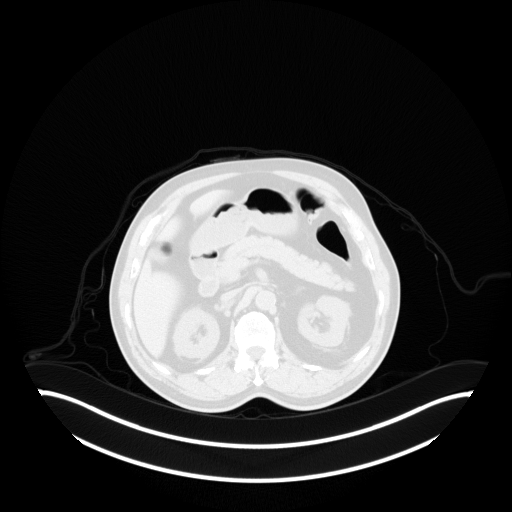
[im 160/213]
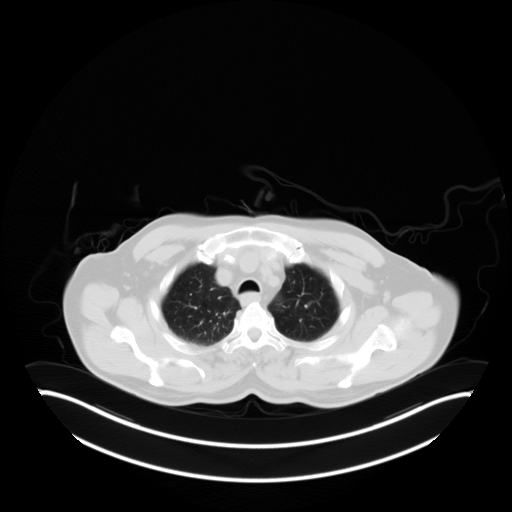
[im 213/213  brain]
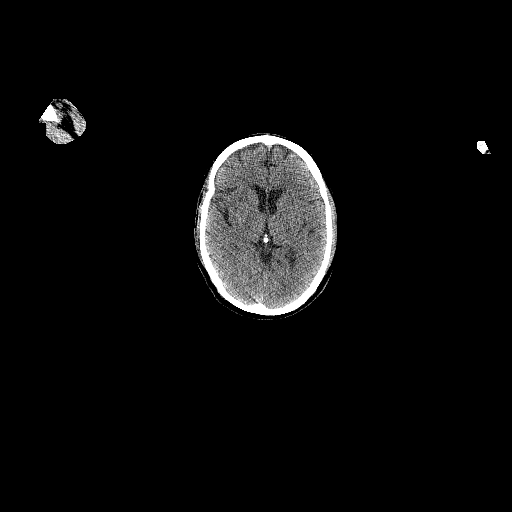

[Series 5: pet sk_thigh nac · axial · 5.0mm · 4.07mm/px · z∈[+957,+1805]mm · 5 of 213 slices shown]
[im 1/213]
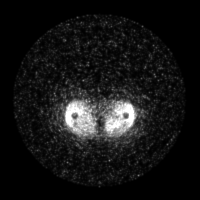
[im 54/213]
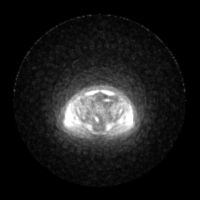
[im 107/213]
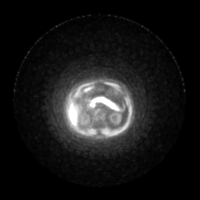
[im 160/213]
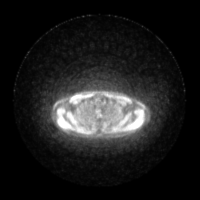
[im 213/213]
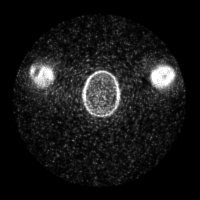

[Series 8: ct sk_thigh 5.0 b70f (id)_bone · axial · 5.0mm · 0.53mm/px · 1 of 59 slices shown]
[im 1/59  bone]
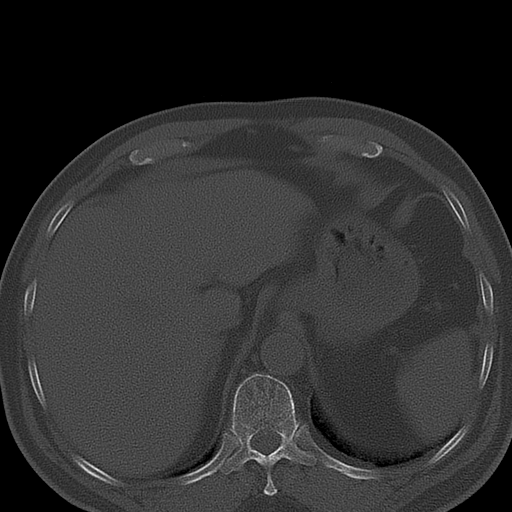

[Series 604: range-ct sk_thigh 5.0 (id)<alpha range> · 1 of 50 slices shown (1 of 2)]
[im 1/50]
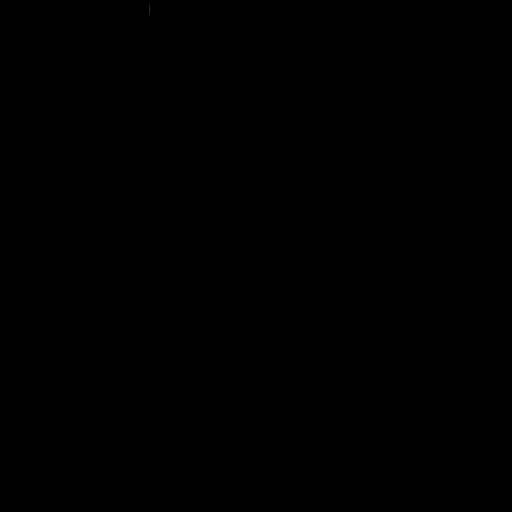

[Series 605: mip range · coronal · 1.76mm/px · 1 of 32 slices shown]
[im 1/32]
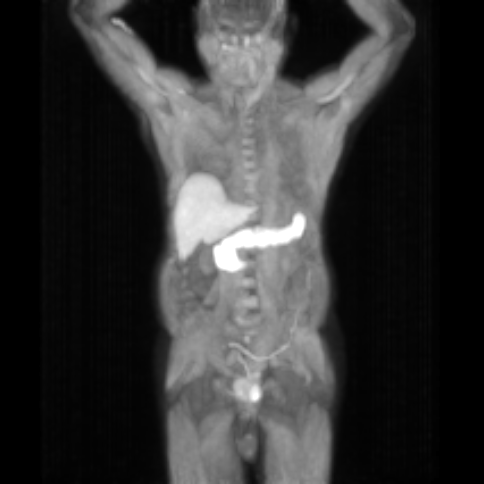

[Series 606: range-ct sk_thigh 5.0 (id)<alpha range> · 5 of 203 slices shown (2 of 2)]
[im 1/203]
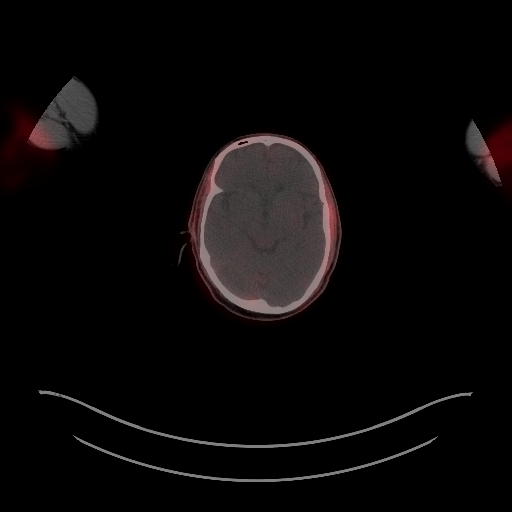
[im 51/203]
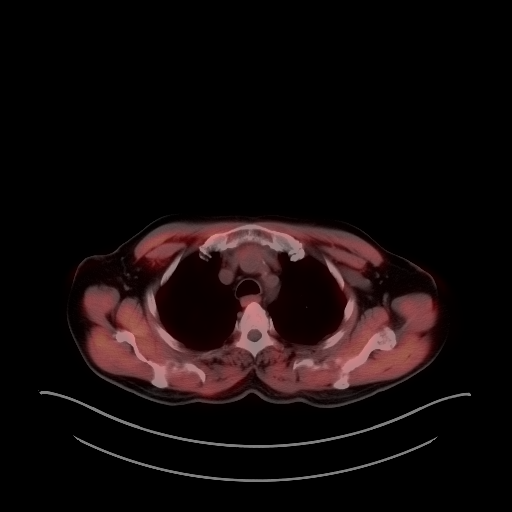
[im 102/203]
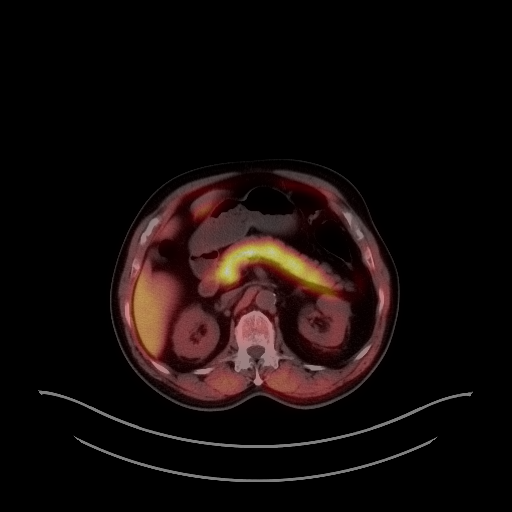
[im 152/203]
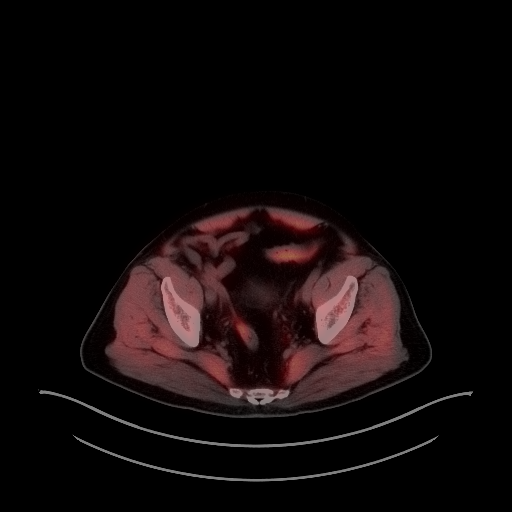
[im 203/203]
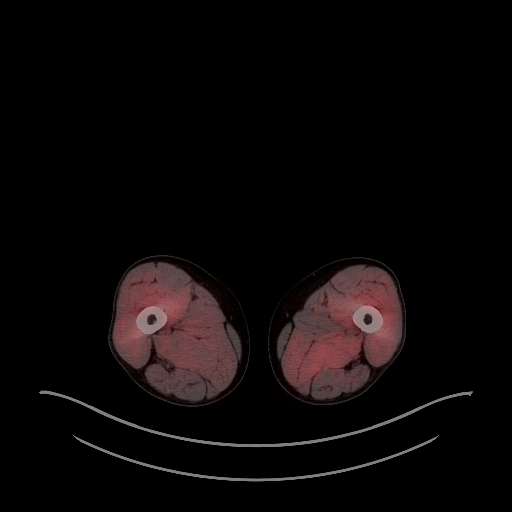

[Series 1056: results mm oncology reading · 1.0mm · 0.89mm/px · 1 of 4 slices shown]
[im 1/4]
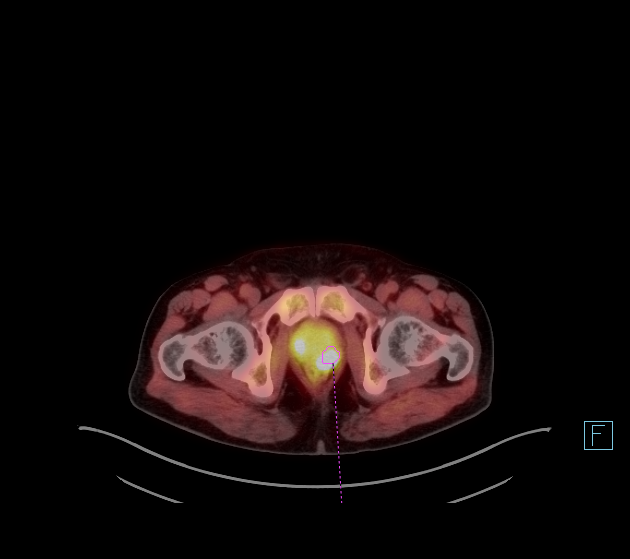

[25 of 25 positions shown; findings below may reference images not displayed]

FINDINGS: NECK

No radiotracer activity in neck lymph nodes.

Incidental CT finding: None

CHEST

No radiotracer accumulation within mediastinal or hilar lymph nodes.
No suspicious pulmonary nodules on the CT scan.

Incidental CT finding: Coronary artery calcification and aortic
atherosclerotic calcification.

ABDOMEN/PELVIS

Prostate: Intense focal radiotracer activity within the prostate
gland. There is lobular focus of intense radiotracer activity in the
posterior LEFT aspect of the prostate gland towards the apex with
SUV max equal 10.2. Second focal region in the RIGHT lateral mid
gland.

Superior to the base of the bladder, there is a nodular focus of
activity which appears to be outside the prostatic capsule adjacent
to the posterior wall the bladder with SUV max equal 4.7. On CT,
there is a nodule along the posterior aspect of the bladder which
may correlate to this activity (image 165/4).

Lymph nodes: A small internal iliac lymph node measures 5 mm (image
158/4) with very faint radiotracer activity (SUV max equal 1.3). No
additional lymph nodes are identified. No periaortic retroperitoneal
nodes.

Liver: No evidence of liver metastasis

Incidental CT finding: Prostate gland is enlarged measuring 49 by 51
mm in axial dimension.

SKELETON

No focal  activity to suggest skeletal metastasis.
IMPRESSION: 1. Intense radiotracer activity within the prostate gland consists
with prostate adenocarcinoma. One lesion in the posterior LEFT apex
bulges the capsule. Second lesion in the RIGHT lateral mid gland.
2. Concern for small nodule of extracapsular carcinoma at the LEFT
base of bladder.
3. No clear evidence of metastatic adenopathy. One small 5 mm lymph
node along the LEFT internal iliac chain has faint radiotracer
activity.
4. No evidence skeletal metastasis.

## 2021-06-02 ENCOUNTER — Encounter: Payer: Self-pay | Admitting: Gastroenterology

## 2021-10-14 ENCOUNTER — Ambulatory Visit: Payer: Medicare Other | Admitting: Cardiology

## 2021-10-31 NOTE — Progress Notes (Signed)
Cardiology Office Note   Date:  11/02/2021   ID:  Keith [name], DOB Nov 16, 1940, MRN [id number]  PCP:  [facility] [place]  Cardiologist:   [name], MD   Chief Complaint  Patient presents with   Coronary Artery Disease      History of Present Illness: Keith [name] is a 81 y.o. male who presents for evaluation of CAD.  In the fall of 2015 he had an abnormal POET (Plain Old Exercise Treadmill) without symptoms.  He had PCI of a diagonal vessel. He had a POET (Plain Old Exercise Treadmill) last year and this was negative for ischemia.    Since I last saw him he says he has done very well.  He has had no new cardiovascular complaints.  It is hard for me to understand whether he is physically active though he mentions doing some stairs.  He mentions walking.  He denies any cardiovascular symptoms with this. The patient denies any new symptoms such as chest discomfort, neck or arm discomfort. There has been no new shortness of breath, PND or orthopnea. There have been no reported palpitations, presyncope or syncope.  He does have some knee discomfort and has had to have injections.   Past Medical History:  Diagnosis Date   CAD (coronary artery disease)    a. NSTEMI 5/12: Xience DES x 2 to RCA; b. cath 5/12: mRCA 99% (tx with PCI); c. 07/2014 Abnl Poet;  d. 07/2014 Cath/PCI: LM 30d, LAD 40-50ost (FFR 0.92), 20p, D1 90 (2.5x22 Orsiro Biotronic study stent), LCX 10, RCA patent stents, PDA 30, EF 50.   DM2 (diabetes mellitus, type 2) ([place])    type 2   HLD (hyperlipidemia)    HTN (hypertension)    Hypothyroidism    Lung mass    LUL mass on chest CT 5/12:  stable on CT 2013   Prostate cancer ([place]) 11/2018    Past Surgical History:  Procedure Laterality Date   LEFT HEART CATHETERIZATION WITH CORONARY ANGIOGRAM N/A 07/28/2014   Procedure: LEFT HEART CATHETERIZATION WITH CORONARY ANGIOGRAM;  Surgeon: Peter M Martinique, MD;  Location[facility] C[facility] LAB;   Service: Cardiovascular;  Laterality: N/A;   ORCHIECTOMY Bilateral 06/20/2019   Procedure: ORCHIECTOMY;  Surgeon: Irine Seal, MD;  Location: [facility] ORS;  Service: Urology;  Laterality: Bilateral;   stent to heart x 2  2015     Current Outpatient Medications  Medication Sig Dispense Refill   amLODipine (NORVASC) 5 MG tablet Take 1 tablet (5 mg total) by mouth daily. 30 tablet 9   aspirin 81 MG tablet Take 81 mg by mouth daily.       Cholecalciferol (VITAMIN D-3) 125 MCG (5000 UT) TABS Take by mouth.     glimepiride (AMARYL) 2 MG tablet Take 4 mg by mouth daily with breakfast. Take 1 tablet at breakfast, and 1 tablet at dinner     levothyroxine (SYNTHROID) 75 MCG tablet Take 75 mcg by mouth every morning.     losartan (COZAAR) 100 MG tablet Take 100 mg by mouth daily.     metFORMIN (GLUCOPHAGE-XR) 500 MG 24 hr tablet SMARTSIG:1 Tablet(s) By Mouth Every Evening     metoprolol succinate (TOPROL-XL) 50 MG 24 hr tablet Take 50 mg by mouth daily. Take with or immediately following a meal.     Multiple Vitamins-Minerals (CENTRUM SILVER PO) Take 1 tablet by mouth daily.     nitroGLYCERIN (NITROSTAT) 0.4 MG SL tablet Place 1 tablet (0.4  mg total) under the tongue every 5 (five) minutes as needed for chest pain. 25 tablet 3   pravastatin (PRAVACHOL) 80 MG tablet Take 80 mg by mouth daily.     No current facility-administered medications for this visit.    Allergies:   Patient has no known allergies.   ROS:  Please see the history of present illness.   Otherwise, review of systems are positive for none.   All other systems are reviewed and negative.    PHYSICAL EXAM: VS:  BP (!) 170/80 (BP Location: Left Arm, Patient Position: Sitting)    Pulse 66    Ht 5\' 5"  (1.651 m)    Wt 126 lb 12.8 oz (57.5 kg)    SpO2 98%    BMI 21.10 kg/m  , BMI Body mass index is 21.1 kg/m. GENERAL:  Well appearing NECK:  No jugular venous distention, waveform within normal limits, carotid upstroke brisk and symmetric, no  bruits, no thyromegaly LUNGS:  Clear to auscultation bilaterally CHEST:  Unremarkable HEART:  PMI not displaced or sustained,S1 and S2 within normal limits, no S3, no S4, no clicks, no rubs, no murmurs ABD:  Flat, positive bowel sounds normal in frequency in pitch, no bruits, no rebound, no guarding, no midline pulsatile mass, no hepatomegaly, no splenomegaly EXT:  2 plus pulses throughout, no edema, no cyanosis no clubbing   EKG:  EKG is  ordered today. The ekg ordered today demonstrates sinus rhythm, rate 66, axis within normal limits, intervals within normal limits, no acute ST-T wave changes.   Recent Labs: No results found for requested labs within last 8760 hours.    Lipid Panel    Component Value Date/Time   CHOL 172 03/10/2011 0730   TRIG 160 (H) 03/10/2011 0730   HDL 44 03/10/2011 0730   CHOLHDL 3.9 03/10/2011 0730   VLDL 32 03/10/2011 0730   LDLCALC  03/10/2011 0730    96        Total Cholesterol/HDL:CHD Risk Coronary Heart Disease Risk Table                     Men   Women  1/2 Average Risk   3.4   3.3  Average Risk       5.0   4.4  2 X Average Risk   9.6   7.1  3 X Average Risk  23.4   11.0        Use the calculated Patient Ratio above and the CHD Risk Table to determine the patient's CHD Risk.        ATP III CLASSIFICATION (LDL):  <100     mg/dL   Optimal  100-129  mg/dL   Near or Above                    Optimal  130-159  mg/dL   Borderline  160-189  mg/dL   High  >190     mg/dL   Very High      Wt Readings from Last 3 Encounters:  11/02/21 126 lb 12.8 oz (57.5 kg)  07/28/20 131 lb (59.4 kg)  07/11/19 128 lb 3.2 oz (58.2 kg)      Other studies Reviewed: Additional studies/ records that were reviewed today include: Labs. Review of the above records demonstrates:  Please see elsewhere in the note.     ASSESSMENT AND PLAN:  CAD (coronary artery disease) -  The patient has no new symptoms.  Has had no  symptoms since his 2018 stress testing.  No  change in therapy and he will continue with secondary risk reduction.   HLD (hyperlipidemia) -  LDL was not quite at target last year.  He is going to get this repeated in March and I would like to review this with a goal LDL less than 70.  HTN (hypertension) -  The blood pressure is elevated today but he says it is in the 130s over 80s at home.  He is going to keep a blood pressure diary and I will make further adjustments as needed.   DM -   A1c was 7.8.  He is having this managed by his primary provider.     Current medicines are reviewed at length with the patient today.  The patient does not have concerns regarding medicines.  The following changes have been made:  None  Labs/ tests ordered today include: None  Orders Placed This Encounter  Procedures   EKG 12-Lead     Disposition:   FU with me in 12 months.     Signed, [name], MD  11/02/2021 4:43 PM    [facility]

## 2021-11-02 ENCOUNTER — Ambulatory Visit (INDEPENDENT_AMBULATORY_CARE_PROVIDER_SITE_OTHER): Payer: Medicare Other | Admitting: Cardiology

## 2021-11-02 ENCOUNTER — Other Ambulatory Visit: Payer: Self-pay

## 2021-11-02 ENCOUNTER — Encounter: Payer: Self-pay | Admitting: Cardiology

## 2021-11-02 VITALS — BP 170/80 | HR 66 | Ht 65.0 in | Wt 126.8 lb

## 2021-11-02 DIAGNOSIS — E785 Hyperlipidemia, unspecified: Secondary | ICD-10-CM | POA: Diagnosis not present

## 2021-11-02 DIAGNOSIS — I10 Essential (primary) hypertension: Secondary | ICD-10-CM | POA: Diagnosis not present

## 2021-11-02 DIAGNOSIS — E118 Type 2 diabetes mellitus with unspecified complications: Secondary | ICD-10-CM

## 2021-11-02 DIAGNOSIS — I2510 Atherosclerotic heart disease of native coronary artery without angina pectoris: Secondary | ICD-10-CM | POA: Diagnosis not present

## 2021-11-02 NOTE — Patient Instructions (Signed)

## 2022-11-17 NOTE — Progress Notes (Signed)
Cardiology Office Note   Date:  11/18/2022   ID:  Keith [name], DOB Mar 08, 1941, MRN [id number]  PCP:  [facility] [place]  Cardiologist:   [name], MD   Chief Complaint  Patient presents with   Coronary Artery Disease      History of Present Illness: Keith [name] is a 82 y.o. male who presents for evaluation of CAD.  In the fall of 2015 he had an abnormal POET (Plain Old Exercise Treadmill) without symptoms.  He had PCI of a diagonal vessel. He had a POET (Plain Old Exercise Treadmill) last year and this was negative for ischemia.    Since I last saw him he has done well.  He had some knee pain.  He does a little walking with this.  He does have some mild shortness of breath.  He does not have chest pressure, neck or arm discomfort.  He is not having any palpitations, presyncope or syncope.  He had no weight gain or edema.   Past Medical History:  Diagnosis Date   CAD (coronary artery disease)    a. NSTEMI 5/12: Xience DES x 2 to RCA; b. cath 5/12: mRCA 99% (tx with PCI); c. 07/2014 Abnl Poet;  d. 07/2014 Cath/PCI: LM 30d, LAD 40-50ost (FFR 0.92), 20p, D1 90 (2.5x22 Orsiro Biotronic study stent), LCX 10, RCA patent stents, PDA 30, EF 50.   DM2 (diabetes mellitus, type 2) ([place])    type 2   HLD (hyperlipidemia)    HTN (hypertension)    Hypothyroidism    Lung mass    LUL mass on chest CT 5/12:  stable on CT 2013   Prostate cancer ([place]) 11/2018    Past Surgical History:  Procedure Laterality Date   LEFT HEART CATHETERIZATION WITH CORONARY ANGIOGRAM N/A 07/28/2014   Procedure: LEFT HEART CATHETERIZATION WITH CORONARY ANGIOGRAM;  Surgeon: Peter M Martinique, MD;  Location[facility] CATH LAB;  Service: Cardiovascular;  Laterality: N/A;   ORCHIECTOMY Bilateral 06/20/2019   Procedure: ORCHIECTOMY;  Surgeon: Irine Seal, MD;  Location: [facility]S;  Service: Urology;  Laterality: Bilateral;   stent to heart x 2  2015     Current Outpatient  Medications  Medication Sig Dispense Refill   amLODipine (NORVASC) 5 MG tablet Take 1 tablet (5 mg total) by mouth daily. 30 tablet 9   aspirin 81 MG tablet Take 81 mg by mouth daily.       Cholecalciferol (VITAMIN D-3) 125 MCG (5000 UT) TABS Take by mouth.     glimepiride (AMARYL) 2 MG tablet Take 4 mg by mouth daily with breakfast. Take 1 tablet at breakfast, and 1 tablet at dinner     levothyroxine (SYNTHROID) 75 MCG tablet Take 75 mcg by mouth every morning.     losartan (COZAAR) 100 MG tablet Take 100 mg by mouth daily.     metFORMIN (GLUCOPHAGE-XR) 500 MG 24 hr tablet SMARTSIG:1 Tablet(s) By Mouth Every Evening     metoprolol succinate (TOPROL-XL) 50 MG 24 hr tablet Take 50 mg by mouth daily. Take with or immediately following a meal.     Multiple Vitamins-Minerals (CENTRUM SILVER PO) Take 1 tablet by mouth daily.     nitroGLYCERIN (NITROSTAT) 0.4 MG SL tablet Place 1 tablet (0.4 mg total) under the tongue every 5 (five) minutes as needed for chest pain. 25 tablet 3   pravastatin (PRAVACHOL) 80 MG tablet Take 80 mg by mouth daily.     No  current facility-administered medications for this visit.    Allergies:   Patient has no known allergies.   ROS:  Please see the history of present illness.   Otherwise, review of systems are positive for none.   All other systems are reviewed and negative.    PHYSICAL EXAM: VS:  BP (!) 142/80 (BP Location: Right Arm, Patient Position: Sitting, Cuff Size: Normal)   Pulse 68   Ht '5\' 5"'$  (1.651 m)   Wt 120 lb (54.4 kg)   BMI 19.97 kg/m  , BMI Body mass index is 19.97 kg/m. GENERAL:  Well appearing NECK:  No jugular venous distention, waveform within normal limits, carotid upstroke brisk and symmetric, no bruits, no thyromegaly LUNGS:  Clear to auscultation bilaterally CHEST:  Unremarkable HEART:  PMI not displaced or sustained,S1 and S2 within normal limits, no S3, no S4, no clicks, no rubs, no murmurs ABD:  Flat, positive bowel sounds normal in  frequency in pitch, no bruits, no rebound, no guarding, no midline pulsatile mass, no hepatomegaly, no splenomegaly EXT:  2 plus pulses throughout, no edema, no cyanosis no clubbing  EKG:  EKG is  ordered today. The ekg ordered today demonstrates sinus rhythm, rate 68, axis within normal limits, intervals within normal limits, no acute ST-T wave changes.   Recent Labs: No results found for requested labs within last 365 days.    Lipid Panel    Component Value Date/Time   CHOL 172 03/10/2011 0730   TRIG 160 (H) 03/10/2011 0730   HDL 44 03/10/2011 0730   CHOLHDL 3.9 03/10/2011 0730   VLDL 32 03/10/2011 0730   LDLCALC  03/10/2011 0730    96        Total Cholesterol/HDL:CHD Risk Coronary Heart Disease Risk Table                     Men   Women  1/2 Average Risk   3.4   3.3  Average Risk       5.0   4.4  2 X Average Risk   9.6   7.1  3 X Average Risk  23.4   11.0        Use the calculated Patient Ratio above and the CHD Risk Table to determine the patient's CHD Risk.        ATP III CLASSIFICATION (LDL):  <100     mg/dL   Optimal  100-129  mg/dL   Near or Above                    Optimal  130-159  mg/dL   Borderline  160-189  mg/dL   High  >190     mg/dL   Very High      Wt Readings from Last 3 Encounters:  11/18/22 120 lb (54.4 kg)  11/02/21 126 lb 12.8 oz (57.5 kg)  07/28/20 131 lb (59.4 kg)      Other studies Reviewed: Additional studies/ records that were reviewed today include: Labs. Review of the above records demonstrates:  Please see elsewhere in the note.     ASSESSMENT AND PLAN:  CAD (coronary artery disease) -  He does have some shortness of breath. I will bring the patient back for a POET (Plain Old Exercise Test). This will allow me to screen for obstructive coronary disease, risk stratify and very importantly provide a prescription for exercise.  HLD (hyperlipidemia) -  LDL was 101 with an HDL of 50.  He has  been slightly hesitant to change meds  since he has done well.  I think it is reasonable for him to be on the pravastatin given patient preference.  No change in therapy.   HTN (hypertension) -  The blood pressure is at target and I reviewed her blood pressure diary.  No change in therapy.   DM -   A1c was 7.1 which is down from 7.8.  He is having this managed by his primary provider.     Current medicines are reviewed at length with the patient today.  The patient does not have concerns regarding medicines.  The following changes have been made:    Labs/ tests ordered today include:   Orders Placed This Encounter  Procedures   Exercise Tolerance Test   EKG 12-Lead     Disposition:   FU with me in 12 months.     Signed, [name], MD  11/18/2022 3:34 PM    [facility]

## 2022-11-18 ENCOUNTER — Encounter: Payer: Self-pay | Admitting: Cardiology

## 2022-11-18 ENCOUNTER — Ambulatory Visit: Payer: Medicare Other | Attending: Cardiology | Admitting: Cardiology

## 2022-11-18 VITALS — BP 142/80 | HR 68 | Ht 65.0 in | Wt 120.0 lb

## 2022-11-18 DIAGNOSIS — R0602 Shortness of breath: Secondary | ICD-10-CM

## 2022-11-18 DIAGNOSIS — E118 Type 2 diabetes mellitus with unspecified complications: Secondary | ICD-10-CM | POA: Diagnosis present

## 2022-11-18 DIAGNOSIS — I2510 Atherosclerotic heart disease of native coronary artery without angina pectoris: Secondary | ICD-10-CM | POA: Diagnosis present

## 2022-11-18 DIAGNOSIS — I10 Essential (primary) hypertension: Secondary | ICD-10-CM

## 2022-11-18 DIAGNOSIS — E785 Hyperlipidemia, unspecified: Secondary | ICD-10-CM | POA: Diagnosis present

## 2022-11-18 NOTE — Patient Instructions (Signed)
Medication Instructions:  Your physician recommends that you continue on your current medications as directed. Please refer to the Current Medication list given to you today.  *If you need a refill on your cardiac medications before your next appointment, please call your pharmacy*   Lab Work: NONE If you have labs (blood work) drawn today and your tests are completely normal, you will receive your results only by: [place] (if you have MyChart) OR A paper copy in the mail If you have any lab test that is abnormal or we need to change your treatment, we will call you to review the results.   Testing/Procedures: Your physician has requested that you have an exercise tolerance test. For further information please visit [email or web address]. Please also follow instruction sheet, as given.    Follow-Up: At [facility], you and your health needs are our priority.  As part of our continuing mission to provide you with exceptional heart care, we have created designated Provider Care Teams.  These Care Teams include your primary Cardiologist (physician) and Advanced Practice Providers (APPs -  Physician Assistants and Nurse Practitioners) who all work together to provide you with the care you need, when you need it.  We recommend signing up for the patient portal called "MyChart".  Sign up information is provided on this After Visit Summary.  MyChart is used to connect with patients for Virtual Visits (Telemedicine).  Patients are able to view lab/test results, encounter notes, upcoming appointments, etc.  Non-urgent messages can be sent to your provider as well.   To learn more about what you can do with MyChart, go to [email or web address].    Your next appointment:   1 year(s)  Provider:   [name], MD     Other Instructions Your physician has requested that you have an exercise tolerance test.  Please also follow instruction sheet, as given. This will take place at  [address] Do not drink or eat foods with caffeine for 24 hours before the test. (Chocolate, coffee, tea, or energy drinks) If you use an inhaler, bring it with you to the test. Do not smoke for 4 hours before the test. Wear comfortable shoes and clothing.

## 2022-11-25 ENCOUNTER — Telehealth (HOSPITAL_COMMUNITY): Payer: Self-pay | Admitting: *Deleted

## 2022-11-25 NOTE — Telephone Encounter (Signed)
ETT instructions given for 11/28/22 appointment at 3:30

## 2022-11-28 ENCOUNTER — Ambulatory Visit (HOSPITAL_COMMUNITY): Payer: Medicare Other | Attending: Cardiology

## 2022-11-28 DIAGNOSIS — R0602 Shortness of breath: Secondary | ICD-10-CM | POA: Diagnosis not present

## 2022-11-29 LAB — EXERCISE TOLERANCE TEST
Angina Index: 0
Duke Treadmill Score: -1
Estimated workload: 5.5
Exercise duration (min): 3 min
Exercise duration (sec): 46 s
MPHR: 138 {beats}/min
Peak HR: 121 {beats}/min
Percent HR: 87 %
RPE: 18
Rest HR: 64 {beats}/min
ST Depression (mm): 1 mm

## 2023-03-08 NOTE — Progress Notes (Signed)
  Cardiology Office Note:   Date:  03/10/2023  ID:  Keith [name], DOB 1941/04/28, MRN [id number]  History of Present Illness:   Keith [name] is a 82 y.o. male  who presents for evaluation of CAD.  In the fall of 2015 he had an abnormal POET (Plain Old Exercise Treadmill) without symptoms.  He had PCI of a diagonal vessel. He had a POET (Plain Old Exercise Treadmill) last year and this was negative for ischemia.  At the last visit he was having chest pain.  POET (Plain Old Exercise Treadmill) demonstrated   I brought him back to discuss the stress test which suggested 1 mm ST depression that persisted 1 minute in recovery.  This was positive for ischemia.  However, the patient did not this time endorsing any symptoms.  Previously he thought but some shortness of breath he says he absolutely does not get any symptoms.  He is up the stairs 10 flights daily to get exercise.  He does something like push-ups.  He denies any cardiovascular symptoms.  Denies any chest pressure, neck or arm discomfort.  He had no weight gain or edema.  ROS:  As stated in the HPI and negative for all other systems.   Studies Reviewed:    EKG:  NA    Risk Assessment/Calculations:         Physical Exam:   VS:  BP (!) 173/70 (BP Location: Right Arm, Patient Position: Sitting, Cuff Size: Normal)   Pulse (!) 58   Ht 5\' 5"  (1.651 m)   Wt 123 lb 3.2 oz (55.9 kg)   SpO2 100%   BMI 20.50 kg/m    Wt Readings from Last 3 Encounters:  03/10/23 123 lb 3.2 oz (55.9 kg)  11/28/22 120 lb (54.4 kg)  11/18/22 120 lb (54.4 kg)     GEN: Well nourished, well developed in no acute distress NECK: No JVD; No carotid bruits CARDIAC: RRR, no murmurs, rubs, gallops RESPIRATORY:  Clear to auscultation without rales, wheezing or rhonchi  ABDOMEN: Soft, non-tender, non-distended EXTREMITIES:  No edema; No deformity   ASSESSMENT AND PLAN:   CAD (coronary artery disease) -  Made a long discussion and he is doing so well  he does not want any further testing.  He is really not interested in med titration.  He will continue meds as listed.   HLD (hyperlipidemia) -  LDL was 86 which is better than previous.  He is hesitant to have any med changes and prefers to stay on pravastatin.   HTN (hypertension) -  The blood pressure is elevated here but at home he says it is well-controlled.  No change in therapy.   DM -   A1c was down to 6.8 from 7.1.  This is better than it had been at 7.8.  No change in therapy.     Signed, Rollene Rotunda, MD

## 2023-03-09 ENCOUNTER — Ambulatory Visit: Payer: Medicare Other | Admitting: Cardiology

## 2023-03-10 ENCOUNTER — Encounter: Payer: Self-pay | Admitting: Cardiology

## 2023-03-10 ENCOUNTER — Ambulatory Visit: Payer: Medicare Other | Attending: Cardiology | Admitting: Cardiology

## 2023-03-10 VITALS — BP 173/70 | HR 58 | Ht 65.0 in | Wt 123.2 lb

## 2023-03-10 DIAGNOSIS — E785 Hyperlipidemia, unspecified: Secondary | ICD-10-CM | POA: Insufficient documentation

## 2023-03-10 DIAGNOSIS — I2510 Atherosclerotic heart disease of native coronary artery without angina pectoris: Secondary | ICD-10-CM | POA: Diagnosis present

## 2023-03-10 DIAGNOSIS — Z7984 Long term (current) use of oral hypoglycemic drugs: Secondary | ICD-10-CM

## 2023-03-10 DIAGNOSIS — E118 Type 2 diabetes mellitus with unspecified complications: Secondary | ICD-10-CM

## 2023-03-10 DIAGNOSIS — I10 Essential (primary) hypertension: Secondary | ICD-10-CM | POA: Diagnosis not present

## 2023-03-10 NOTE — Patient Instructions (Signed)
   Follow-Up: At [facility], you and your health needs are our priority.  As part of our continuing mission to provide you with exceptional heart care, we have created designated Provider Care Teams.  These Care Teams include your primary Cardiologist (physician) and Advanced Practice Providers (APPs -  Physician Assistants and Nurse Practitioners) who all work together to provide you with the care you need, when you need it.  We recommend signing up for the patient portal called "MyChart".  Sign up information is provided on this After Visit Summary.  MyChart is used to connect with patients for Virtual Visits (Telemedicine).  Patients are able to view lab/test results, encounter notes, upcoming appointments, etc.  Non-urgent messages can be sent to your provider as well.   To learn more about what you can do with MyChart, go to [email or web address].    Your next appointment:   12 month(s)  Provider:   James Hochrein, MD      

## 2024-04-03 NOTE — Progress Notes (Signed)
 Cardiology Office Note:   Date:  04/04/2024  ID:  Keith [name], DOB 04-12-1941, MRN [id number] PCP: [facility] Providers Cardiologist:  Eilleen Grates, MD {  History of Present Illness:   Keith [name] is a 83 y.o. male who presents for evaluation of CAD.  In the fall of 2015 he had an abnormal POET (Plain Old Exercise Treadmill) without symptoms.  He had PCI of a diagonal vessel. He had a POET (Plain Old Exercise Treadmill) last year and this was negative for ischemia.  At the last visit he was having chest pain.  POET (Plain Old Exercise Treadmill) demonstrated 1 mm ST depression that persisted 1 minute in recovery.  I managed this medically.  He was not having symptoms.    Since I last saw him he has done well.  He exercises for 15 to 20 minutes every day doing 9 exercises. The patient denies any new symptoms such as chest discomfort, neck or arm discomfort. There has been no new shortness of breath, PND or orthopnea. There have been no reported palpitations, presyncope or syncope.   ROS: As stated in the HPI and negative for all other systems.  Studies Reviewed:    EKG:   EKG Interpretation Date/Time:  Thursday April 04 2024 15:01:41 EDT Ventricular Rate:  66 PR Interval:  158 QRS Duration:  130 QT Interval:  428 QTC Calculation: 448 R Axis:   -42  Text Interpretation: Normal sinus rhythm Left axis deviation Left bundle branch block When compared with ECG of 17-Jun-2019 14:10, Left bundle branch block is now Present Confirmed by Eilleen Grates ([id number]) on 04/04/2024 3:08:00 PM    Risk Assessment/Calculations:       Physical Exam:   VS:  BP (!) 144/72   Pulse 69   Ht 5' 5 (1.651 m)   Wt 125 lb 6.4 oz (56.9 kg)   SpO2 97%   BMI 20.87 kg/m    Wt Readings from Last 3 Encounters:  04/04/24 125 lb 6.4 oz (56.9 kg)  03/10/23 123 lb 3.2 oz (55.9 kg)  11/28/22 120 lb (54.4 kg)     GEN: Well nourished, well  developed in no acute distress NECK: No JVD; No carotid bruits CARDIAC: RRR, no murmurs, rubs, gallops RESPIRATORY:  Clear to auscultation without rales, wheezing or rhonchi  ABDOMEN: Soft, non-tender, non-distended EXTREMITIES:  No edema; No deformity   ASSESSMENT AND PLAN:   CAD (coronary artery disease) -  I again had a long conversation with the patient.  He says he is doing very well.  He has not been very interested in med titration previously and has had no new symptoms since his stress test a couple of years ago.  He will continue with aggressive risk reduction.   HLD (hyperlipidemia) -  LDL was 55.  This was in March.  No change in therapy.   HTN (hypertension) -  The blood pressure is controlled at home with blood pressures in the 120s over 70s.  No change in therapy.    DM -   A1c was 7.2.  No change in therapy.   I have asked him to discuss this with his primary provider as he might need up titration of his meds including metformin.   LBBB - We had a long discussion about this.  He was on Acupuncturist in [place] so I think he comprehended the conversation.  He has not had any symptoms related to this.  We gave him a copy of his EKG.  At this point I do not think further change in therapy or imaging is indicated but he will let me know if he has any symptoms consistent with bradycardia arrhythmias in the future.    Follow up with me in 1 year or sooner if needed.  Signed, Eilleen Grates, MD

## 2024-04-04 ENCOUNTER — Encounter: Payer: Self-pay | Admitting: Cardiology

## 2024-04-04 ENCOUNTER — Ambulatory Visit: Attending: Cardiology | Admitting: Cardiology

## 2024-04-04 VITALS — BP 144/72 | HR 69 | Ht 65.0 in | Wt 125.4 lb

## 2024-04-04 DIAGNOSIS — I2510 Atherosclerotic heart disease of native coronary artery without angina pectoris: Secondary | ICD-10-CM | POA: Diagnosis present

## 2024-04-04 DIAGNOSIS — E118 Type 2 diabetes mellitus with unspecified complications: Secondary | ICD-10-CM | POA: Insufficient documentation

## 2024-04-04 DIAGNOSIS — I10 Essential (primary) hypertension: Secondary | ICD-10-CM | POA: Insufficient documentation

## 2024-04-04 DIAGNOSIS — E785 Hyperlipidemia, unspecified: Secondary | ICD-10-CM | POA: Diagnosis present

## 2024-04-04 NOTE — Patient Instructions (Signed)
 Medication Instructions:  Your physician recommends that you continue on your current medications as directed. Please refer to the Current Medication list given to you today.  *If you need a refill on your cardiac medications before your next appointment, please call your pharmacy*  Lab Work: NONE If you have labs (blood work) drawn today and your tests are completely normal, you will receive your results only by: MyChart Message (if you have MyChart) OR A paper copy in the mail If you have any lab test that is abnormal or we need to change your treatment, we will call you to review the results.  Testing/Procedures: NONE  Follow-Up: At [facility], you and your health needs are our priority.  As part of our continuing mission to provide you with exceptional heart care, our providers are all part of one team.  This team includes your primary Cardiologist (physician) and Advanced Practice Providers or APPs (Physician Assistants and Nurse Practitioners) who all work together to provide you with the care you need, when you need it.  Your next appointment:   1 year  Provider:   Lavonne Prairie, MD  We recommend signing up for the patient portal called MyChart.  Sign up information is provided on this After Visit Summary.  MyChart is used to connect with patients for Virtual Visits (Telemedicine).  Patients are able to view lab/test results, encounter notes, upcoming appointments, etc.  Non-urgent messages can be sent to your provider as well.   To learn more about what you can do with MyChart, go to [email or web address].
